# Patient Record
Sex: Female | Born: 1935 | Hispanic: No | Smoking: Never smoker
Health system: Southern US, Community
[De-identification: ages and names within clinical notes are randomized; demographics above are authoritative.]

## PROBLEM LIST (undated history)

## (undated) ENCOUNTER — Emergency Department (HOSPITAL_COMMUNITY): Admission: EM | Payer: Medicare HMO | Source: Home / Self Care

## (undated) DIAGNOSIS — I351 Nonrheumatic aortic (valve) insufficiency: Secondary | ICD-10-CM

## (undated) DIAGNOSIS — I5032 Chronic diastolic (congestive) heart failure: Secondary | ICD-10-CM

## (undated) HISTORY — DX: Chronic diastolic (congestive) heart failure: I50.32

## (undated) HISTORY — DX: Nonrheumatic aortic (valve) insufficiency: I35.1

---

## 1984-02-01 HISTORY — PX: RHINOPLASTY: SUR1284

## 2000-10-04 ENCOUNTER — Other Ambulatory Visit: Admission: RE | Admit: 2000-10-04 | Discharge: 2000-10-04 | Payer: Self-pay | Admitting: Gastroenterology

## 2000-10-04 ENCOUNTER — Encounter (INDEPENDENT_AMBULATORY_CARE_PROVIDER_SITE_OTHER): Payer: Self-pay | Admitting: Specialist

## 2000-11-23 ENCOUNTER — Ambulatory Visit (HOSPITAL_COMMUNITY): Admission: RE | Admit: 2000-11-23 | Discharge: 2000-11-23 | Payer: Self-pay | Admitting: Gastroenterology

## 2000-11-23 ENCOUNTER — Encounter: Payer: Self-pay | Admitting: Gastroenterology

## 2008-03-10 ENCOUNTER — Ambulatory Visit: Payer: Self-pay | Admitting: Cardiology

## 2009-05-14 ENCOUNTER — Encounter (INDEPENDENT_AMBULATORY_CARE_PROVIDER_SITE_OTHER): Payer: Self-pay | Admitting: *Deleted

## 2009-05-18 ENCOUNTER — Ambulatory Visit: Payer: Self-pay | Admitting: Internal Medicine

## 2009-05-31 HISTORY — PX: COLONOSCOPY: SHX174

## 2009-06-12 ENCOUNTER — Ambulatory Visit: Payer: Self-pay | Admitting: Internal Medicine

## 2010-03-04 NOTE — Miscellaneous (Signed)
Summary: previsit/rm  Clinical Lists Changes  Medications: Added new medication of MIRALAX   POWD (POLYETHYLENE GLYCOL 3350) As per prep  instructions. - Signed Added new medication of DULCOLAX 5 MG  TBEC (BISACODYL) Day before procedure take 2 at 3pm and 2 at 8pm. - Signed Added new medication of REGLAN 10 MG  TABS (METOCLOPRAMIDE HCL) As per prep instructions. - Signed Rx of MIRALAX   POWD (POLYETHYLENE GLYCOL 3350) As per prep  instructions.;  #255gm x 0;  Signed;  Entered by: Sherren Kerns RN;  Authorized by: Hart Carwin MD;  Method used: Electronically to [facility]*, [address] [place], [place], [place]  [address], Ph: [phone], Fax: [phone] Rx of DULCOLAX 5 MG  TBEC (BISACODYL) Day before procedure take 2 at 3pm and 2 at 8pm.;  #4 x 0;  Signed;  Entered by: Sherren Kerns RN;  Authorized by: Hart Carwin MD;  Method used: Electronically to [facility]*, [address] [place], [place], [place]  [address], Ph: [phone], Fax: [phone] Rx of REGLAN 10 MG  TABS (METOCLOPRAMIDE HCL) As per prep instructions.;  #2 x 0;  Signed;  Entered by: Sherren Kerns RN;  Authorized by: Hart Carwin MD;  Method used: Electronically to [facility]*, [address] [place], [place], [place]  [address], Ph: [phone], Fax: [phone] Observations: Added new observation of ALLERGY REV: Done (05/18/2009 11:49)    Prescriptions: REGLAN 10 MG  TABS (METOCLOPRAMIDE HCL) As per prep instructions.  #2 x 0   Entered by:   Sherren Kerns RN   Authorized by:   Hart Carwin MD   Signed by:   Sherren Kerns RN on 05/18/2009   Method used:   Electronically to        [facility]* (retail)       [address]       [place], [place]  [address]       Ph: [phone]       Fax: [phone]   RxID:   [id number] DULCOLAX 5 MG  TBEC (BISACODYL) Day before procedure take 2 at 3pm and 2 at 8pm.  #4 x 0   Entered by:    Sherren Kerns RN   Authorized by:   Hart Carwin MD   Signed by:   Sherren Kerns RN on 05/18/2009   Method used:   Electronically to        [facility]* (retail)       [address]       [place], [place]  [address]       Ph: [phone]       Fax: [phone]   RxID:   [id number] MIRALAX   POWD (POLYETHYLENE GLYCOL 3350) As per prep  instructions.  #255gm x 0   Entered by:   Sherren Kerns RN   Authorized by:   Hart Carwin MD   Signed by:   Sherren Kerns RN on 05/18/2009   Method used:   Electronically to        [facility]* (retail)       [address]       [place], [place]  [address]       Ph: [phone]       Fax: [phone]  RxID:   [id number]

## 2010-03-04 NOTE — Procedures (Signed)
Summary: Colonoscopy  Patient: Karla Dunn Note: All result statuses are Final unless otherwise noted.  Tests: (1) Colonoscopy (COL)   COL Colonoscopy           DONE     [facility]     [address]     [place], [place]  [address]           COLONOSCOPY PROCEDURE REPORT           PATIENT:  [name], Name  MR#:  [id number]     BIRTHDATE:  11-Nov-1935, 73 yrs. old  GENDER:  female     ENDOSCOPIST:  Hedwig Morton. Juanda Chance, MD     REF. BY: Dr Tamera Reason,, [facility],[place]     PROCEDURE DATE:  06/12/2009     PROCEDURE:  Colonoscopy 29562     ASA CLASS:  Class II     INDICATIONS:  Routine Risk Screening lastexam was flex. sigm. in     2002, normal     MEDICATIONS:   Versed 5 mg, Fentanyl 50 mcg           DESCRIPTION OF PROCEDURE:   After the risks benefits and     alternatives of the procedure were thoroughly explained, informed     consent was obtained.  Digital rectal exam was performed and     revealed no rectal masses.   The LB [id number] endoscope     was introduced through the anus and advanced to the cecum, which     was identified by both the appendix and ileocecal valve, without     limitations.  The quality of the prep was good, using MiraLax.     The instrument was then slowly withdrawn as the colon was fully     examined.     <<PROCEDUREIMAGES>>           FINDINGS:  No polyps or cancers were seen (see image1, image2, and     image3).   Retroflexed views in the rectum revealed no     abnormalities.    The scope was then withdrawn from the patient     and the procedure completed.           COMPLICATIONS:  None     ENDOSCOPIC IMPRESSION:     1) No polyps or cancers     RECOMMENDATIONS:     1) high fiber diet     REPEAT EXAM:  In 10 year(s) for.           ______________________________     Hedwig Morton. Juanda Chance, MD           CC:           n.     eSIGNED:   Hedwig Morton. Kashara Blocher at 06/12/2009 11:27 AM           Karla Dunn, [id number]  Note: An  exclamation mark (!) indicates a result that was not dispersed into the flowsheet. Document Creation Date: 06/12/2009 11:27 AM _______________________________________________________________________  (1) Order result status: Final Collection or observation date-time: 06/12/2009 11:20 Requested date-time:  Receipt date-time:  Reported date-time:  Referring Physician:   Ordering Physician: Lina Sar [phone]) Specimen Source:  Source: Launa Grill Order Number: [id number] Lab site:   Appended Document: Colonoscopy    Clinical Lists Changes  Observations: Added new observation of COLONNXTDUE: 06/2019 (06/12/2009 11:32)

## 2010-03-04 NOTE — Letter (Signed)
Summary: [facility] Instructions  [facility]  [address] [place], [place] [address]   Phone: [phone]  Fax: [phone]       SYNDI PUA    1935-07-03    MRN: [id number]       Procedure Day /Date:  Friday 06/12/09     Arrival Time:  10:00AM     Procedure Time:  11:00AM     Location of Procedure:                    [facility]res _  [facility] (4th Floor)    PREPARATION FOR COLONOSCOPY WITH MIRALAX  Starting 5 days prior to your procedure 06/07/09 do not eat nuts, seeds, popcorn, corn, beans, peas,  salads, or any raw vegetables.  Do not take any fiber supplements (e.g. Metamucil, Citrucel, and Benefiber). ____________________________________________________________________________________________________   THE DAY BEFORE YOUR PROCEDURE         DATE:  06/11/09 DAY: Thursday  1   Drink clear liquids the entire day-NO SOLID FOOD  2   Do not drink anything colored red or purple.  Avoid juices with pulp.  No orange juice.  3   Drink at least 64 oz. (8 glasses) of fluid/clear liquids during the day to prevent dehydration and help the prep work efficiently.  CLEAR LIQUIDS INCLUDE: Water Jello Ice Popsicles Tea (sugar ok, no milk/cream) Powdered fruit flavored drinks Coffee (sugar ok, no milk/cream) Gatorade Juice: apple, white grape, white cranberry  Lemonade Clear bullion, consomm, broth Carbonated beverages (any kind) Strained chicken noodle soup Hard Candy  4   Mix the entire bottle of Miralax with 64 oz. of Gatorade/Powerade in the morning and put in the refrigerator to chill.  5   At 3:00 pm take 2 Dulcolax/Bisacodyl tablets.  6   At 4:30 pm take one Reglan/Metoclopramide tablet.  7  Starting at 5:00 pm drink one 8 oz glass of the Miralax mixture every 15-20 minutes until you have finished drinking the entire 64 oz.  You should finish drinking prep around 7:30 or 8:00 pm.  8   If you are nauseated, you may take the 2nd Reglan/Metoclopramide  tablet at 6:30 pm.        9    At 8:00 pm take 2 more DULCOLAX/Bisacodyl tablets.     THE DAY OF YOUR PROCEDURE      DATE:  06/12/09  DAY: Friday  You may drink clear liquids until 9:00AM  (2 HOURS BEFORE PROCEDURE).   MEDICATION INSTRUCTIONS  Unless otherwise instructed, you should take regular prescription medications with a small sip of water as early as possible the morning of your procedure.   Additional medication instructions: Hold Spiron-HCTZ morning of procedure only.         OTHER INSTRUCTIONS  You will need a responsible adult at least 73 years of age to accompany you and drive you home.   This person must remain in the waiting room during your procedure.  Wear loose fitting clothing that is easily removed.  Leave jewelry and other valuables at home.  However, you may wish to bring a book to read or an iPod/MP3 player to listen to music as you wait for your procedure to start.  Remove all body piercing jewelry and leave at home.  Total time from sign-in until discharge is approximately 2-3 hours.  You should go home directly after your procedure and rest.  You can resume normal activities the day after your procedure.  The  day of your procedure you should not:   Drive   Make legal decisions   Operate machinery   Drink alcohol   Return to work  You will receive specific instructions about eating, activities and medications before you leave.   The above instructions have been reviewed and explained to me by   Sherren Kerns RN  May 18, 2009 11:34 AM     I fully understand and can verbalize these instructions _____________________________ Date _______

## 2010-11-17 HISTORY — PX: CATARACT EXTRACTION: SUR2

## 2013-07-24 ENCOUNTER — Encounter: Payer: Self-pay | Admitting: Gastroenterology

## 2013-10-26 ENCOUNTER — Encounter: Payer: Self-pay | Admitting: Gastroenterology

## 2013-10-26 ENCOUNTER — Encounter: Payer: Self-pay | Admitting: Internal Medicine

## 2014-09-01 HISTORY — PX: OTHER SURGICAL HISTORY: SHX169

## 2015-03-12 DIAGNOSIS — K210 Gastro-esophageal reflux disease with esophagitis: Secondary | ICD-10-CM | POA: Diagnosis not present

## 2015-03-12 DIAGNOSIS — D509 Iron deficiency anemia, unspecified: Secondary | ICD-10-CM | POA: Diagnosis not present

## 2015-03-12 DIAGNOSIS — N8110 Cystocele, unspecified: Secondary | ICD-10-CM | POA: Diagnosis not present

## 2015-03-12 DIAGNOSIS — R269 Unspecified abnormalities of gait and mobility: Secondary | ICD-10-CM | POA: Diagnosis not present

## 2015-03-12 DIAGNOSIS — N182 Chronic kidney disease, stage 2 (mild): Secondary | ICD-10-CM | POA: Diagnosis not present

## 2015-03-12 DIAGNOSIS — N3946 Mixed incontinence: Secondary | ICD-10-CM | POA: Diagnosis not present

## 2015-03-12 DIAGNOSIS — N816 Rectocele: Secondary | ICD-10-CM | POA: Diagnosis not present

## 2015-03-12 DIAGNOSIS — R69 Illness, unspecified: Secondary | ICD-10-CM | POA: Diagnosis not present

## 2015-03-12 DIAGNOSIS — I10 Essential (primary) hypertension: Secondary | ICD-10-CM | POA: Diagnosis not present

## 2015-03-12 DIAGNOSIS — E1169 Type 2 diabetes mellitus with other specified complication: Secondary | ICD-10-CM | POA: Diagnosis not present

## 2015-03-12 DIAGNOSIS — H9193 Unspecified hearing loss, bilateral: Secondary | ICD-10-CM | POA: Diagnosis not present

## 2015-03-16 DIAGNOSIS — M329 Systemic lupus erythematosus, unspecified: Secondary | ICD-10-CM | POA: Diagnosis not present

## 2015-03-16 DIAGNOSIS — M6281 Muscle weakness (generalized): Secondary | ICD-10-CM | POA: Diagnosis not present

## 2015-03-16 DIAGNOSIS — I679 Cerebrovascular disease, unspecified: Secondary | ICD-10-CM | POA: Diagnosis not present

## 2015-03-16 DIAGNOSIS — R32 Unspecified urinary incontinence: Secondary | ICD-10-CM | POA: Diagnosis not present

## 2015-03-30 DIAGNOSIS — M6281 Muscle weakness (generalized): Secondary | ICD-10-CM | POA: Diagnosis not present

## 2015-03-30 DIAGNOSIS — R2681 Unsteadiness on feet: Secondary | ICD-10-CM | POA: Diagnosis not present

## 2015-03-30 DIAGNOSIS — I679 Cerebrovascular disease, unspecified: Secondary | ICD-10-CM | POA: Diagnosis not present

## 2015-04-02 DIAGNOSIS — R2681 Unsteadiness on feet: Secondary | ICD-10-CM | POA: Diagnosis not present

## 2015-04-02 DIAGNOSIS — M6281 Muscle weakness (generalized): Secondary | ICD-10-CM | POA: Diagnosis not present

## 2015-04-16 ENCOUNTER — Ambulatory Visit (INDEPENDENT_AMBULATORY_CARE_PROVIDER_SITE_OTHER): Payer: Self-pay | Admitting: Otolaryngology

## 2015-04-23 DIAGNOSIS — N8110 Cystocele, unspecified: Secondary | ICD-10-CM | POA: Diagnosis not present

## 2015-04-23 DIAGNOSIS — I10 Essential (primary) hypertension: Secondary | ICD-10-CM | POA: Diagnosis not present

## 2015-04-23 DIAGNOSIS — R69 Illness, unspecified: Secondary | ICD-10-CM | POA: Diagnosis not present

## 2015-04-23 DIAGNOSIS — N816 Rectocele: Secondary | ICD-10-CM | POA: Diagnosis not present

## 2015-04-23 DIAGNOSIS — E1169 Type 2 diabetes mellitus with other specified complication: Secondary | ICD-10-CM | POA: Diagnosis not present

## 2015-04-23 DIAGNOSIS — K210 Gastro-esophageal reflux disease with esophagitis: Secondary | ICD-10-CM | POA: Diagnosis not present

## 2015-04-23 DIAGNOSIS — N182 Chronic kidney disease, stage 2 (mild): Secondary | ICD-10-CM | POA: Diagnosis not present

## 2015-05-05 DIAGNOSIS — M6281 Muscle weakness (generalized): Secondary | ICD-10-CM | POA: Diagnosis not present

## 2015-05-05 DIAGNOSIS — I739 Peripheral vascular disease, unspecified: Secondary | ICD-10-CM | POA: Diagnosis not present

## 2015-05-05 DIAGNOSIS — R2681 Unsteadiness on feet: Secondary | ICD-10-CM | POA: Diagnosis not present

## 2015-05-21 DIAGNOSIS — H109 Unspecified conjunctivitis: Secondary | ICD-10-CM | POA: Diagnosis not present

## 2015-08-17 DIAGNOSIS — I8310 Varicose veins of unspecified lower extremity with inflammation: Secondary | ICD-10-CM | POA: Diagnosis not present

## 2015-08-17 DIAGNOSIS — L821 Other seborrheic keratosis: Secondary | ICD-10-CM | POA: Diagnosis not present

## 2015-08-26 DIAGNOSIS — M329 Systemic lupus erythematosus, unspecified: Secondary | ICD-10-CM | POA: Diagnosis not present

## 2015-08-26 DIAGNOSIS — M810 Age-related osteoporosis without current pathological fracture: Secondary | ICD-10-CM | POA: Diagnosis not present

## 2015-09-15 DIAGNOSIS — M3213 Lung involvement in systemic lupus erythematosus: Secondary | ICD-10-CM | POA: Diagnosis not present

## 2015-09-15 DIAGNOSIS — Z961 Presence of intraocular lens: Secondary | ICD-10-CM | POA: Diagnosis not present

## 2015-09-15 DIAGNOSIS — Z79899 Other long term (current) drug therapy: Secondary | ICD-10-CM | POA: Diagnosis not present

## 2015-10-21 DIAGNOSIS — Z23 Encounter for immunization: Secondary | ICD-10-CM | POA: Diagnosis not present

## 2015-10-21 DIAGNOSIS — D509 Iron deficiency anemia, unspecified: Secondary | ICD-10-CM | POA: Diagnosis not present

## 2015-10-21 DIAGNOSIS — R69 Illness, unspecified: Secondary | ICD-10-CM | POA: Diagnosis not present

## 2015-10-21 DIAGNOSIS — E1169 Type 2 diabetes mellitus with other specified complication: Secondary | ICD-10-CM | POA: Diagnosis not present

## 2015-11-02 DIAGNOSIS — H906 Mixed conductive and sensorineural hearing loss, bilateral: Secondary | ICD-10-CM | POA: Diagnosis not present

## 2015-11-09 DIAGNOSIS — R269 Unspecified abnormalities of gait and mobility: Secondary | ICD-10-CM | POA: Diagnosis not present

## 2015-11-09 DIAGNOSIS — R2689 Other abnormalities of gait and mobility: Secondary | ICD-10-CM | POA: Diagnosis not present

## 2015-12-18 DIAGNOSIS — E1169 Type 2 diabetes mellitus with other specified complication: Secondary | ICD-10-CM | POA: Diagnosis not present

## 2015-12-18 DIAGNOSIS — I10 Essential (primary) hypertension: Secondary | ICD-10-CM | POA: Diagnosis not present

## 2015-12-18 DIAGNOSIS — M810 Age-related osteoporosis without current pathological fracture: Secondary | ICD-10-CM | POA: Diagnosis not present

## 2015-12-18 DIAGNOSIS — N182 Chronic kidney disease, stage 2 (mild): Secondary | ICD-10-CM | POA: Diagnosis not present

## 2015-12-18 DIAGNOSIS — K210 Gastro-esophageal reflux disease with esophagitis: Secondary | ICD-10-CM | POA: Diagnosis not present

## 2015-12-18 DIAGNOSIS — D509 Iron deficiency anemia, unspecified: Secondary | ICD-10-CM | POA: Diagnosis not present

## 2015-12-22 DIAGNOSIS — I10 Essential (primary) hypertension: Secondary | ICD-10-CM | POA: Diagnosis not present

## 2015-12-22 DIAGNOSIS — E1169 Type 2 diabetes mellitus with other specified complication: Secondary | ICD-10-CM | POA: Diagnosis not present

## 2015-12-22 DIAGNOSIS — K210 Gastro-esophageal reflux disease with esophagitis: Secondary | ICD-10-CM | POA: Diagnosis not present

## 2015-12-22 DIAGNOSIS — D509 Iron deficiency anemia, unspecified: Secondary | ICD-10-CM | POA: Diagnosis not present

## 2015-12-22 DIAGNOSIS — R69 Illness, unspecified: Secondary | ICD-10-CM | POA: Diagnosis not present

## 2015-12-22 DIAGNOSIS — H9193 Unspecified hearing loss, bilateral: Secondary | ICD-10-CM | POA: Diagnosis not present

## 2015-12-22 DIAGNOSIS — Z681 Body mass index (BMI) 19 or less, adult: Secondary | ICD-10-CM | POA: Diagnosis not present

## 2016-02-15 DIAGNOSIS — H9193 Unspecified hearing loss, bilateral: Secondary | ICD-10-CM | POA: Diagnosis not present

## 2016-02-15 DIAGNOSIS — K210 Gastro-esophageal reflux disease with esophagitis: Secondary | ICD-10-CM | POA: Diagnosis not present

## 2016-02-15 DIAGNOSIS — Z681 Body mass index (BMI) 19 or less, adult: Secondary | ICD-10-CM | POA: Diagnosis not present

## 2016-02-15 DIAGNOSIS — E1169 Type 2 diabetes mellitus with other specified complication: Secondary | ICD-10-CM | POA: Diagnosis not present

## 2016-02-15 DIAGNOSIS — I10 Essential (primary) hypertension: Secondary | ICD-10-CM | POA: Diagnosis not present

## 2016-02-15 DIAGNOSIS — M545 Low back pain: Secondary | ICD-10-CM | POA: Diagnosis not present

## 2016-02-15 DIAGNOSIS — M329 Systemic lupus erythematosus, unspecified: Secondary | ICD-10-CM | POA: Diagnosis not present

## 2016-02-15 DIAGNOSIS — D509 Iron deficiency anemia, unspecified: Secondary | ICD-10-CM | POA: Diagnosis not present

## 2016-02-15 DIAGNOSIS — N182 Chronic kidney disease, stage 2 (mild): Secondary | ICD-10-CM | POA: Diagnosis not present

## 2016-02-15 DIAGNOSIS — R69 Illness, unspecified: Secondary | ICD-10-CM | POA: Diagnosis not present

## 2016-02-22 DIAGNOSIS — E1122 Type 2 diabetes mellitus with diabetic chronic kidney disease: Secondary | ICD-10-CM | POA: Diagnosis not present

## 2016-02-22 DIAGNOSIS — F329 Major depressive disorder, single episode, unspecified: Secondary | ICD-10-CM | POA: Diagnosis not present

## 2016-02-22 DIAGNOSIS — I129 Hypertensive chronic kidney disease with stage 1 through stage 4 chronic kidney disease, or unspecified chronic kidney disease: Secondary | ICD-10-CM | POA: Diagnosis not present

## 2016-02-22 DIAGNOSIS — M329 Systemic lupus erythematosus, unspecified: Secondary | ICD-10-CM | POA: Diagnosis not present

## 2016-02-22 DIAGNOSIS — D631 Anemia in chronic kidney disease: Secondary | ICD-10-CM | POA: Diagnosis not present

## 2016-02-22 DIAGNOSIS — N189 Chronic kidney disease, unspecified: Secondary | ICD-10-CM | POA: Diagnosis not present

## 2016-02-22 DIAGNOSIS — R2689 Other abnormalities of gait and mobility: Secondary | ICD-10-CM | POA: Diagnosis not present

## 2016-02-22 DIAGNOSIS — R69 Illness, unspecified: Secondary | ICD-10-CM | POA: Diagnosis not present

## 2016-02-22 DIAGNOSIS — M810 Age-related osteoporosis without current pathological fracture: Secondary | ICD-10-CM | POA: Diagnosis not present

## 2016-02-22 DIAGNOSIS — K219 Gastro-esophageal reflux disease without esophagitis: Secondary | ICD-10-CM | POA: Diagnosis not present

## 2016-02-23 DIAGNOSIS — N189 Chronic kidney disease, unspecified: Secondary | ICD-10-CM | POA: Diagnosis not present

## 2016-02-23 DIAGNOSIS — I129 Hypertensive chronic kidney disease with stage 1 through stage 4 chronic kidney disease, or unspecified chronic kidney disease: Secondary | ICD-10-CM | POA: Diagnosis not present

## 2016-02-23 DIAGNOSIS — K219 Gastro-esophageal reflux disease without esophagitis: Secondary | ICD-10-CM | POA: Diagnosis not present

## 2016-02-23 DIAGNOSIS — D631 Anemia in chronic kidney disease: Secondary | ICD-10-CM | POA: Diagnosis not present

## 2016-02-23 DIAGNOSIS — R2689 Other abnormalities of gait and mobility: Secondary | ICD-10-CM | POA: Diagnosis not present

## 2016-02-23 DIAGNOSIS — E1122 Type 2 diabetes mellitus with diabetic chronic kidney disease: Secondary | ICD-10-CM | POA: Diagnosis not present

## 2016-02-23 DIAGNOSIS — R69 Illness, unspecified: Secondary | ICD-10-CM | POA: Diagnosis not present

## 2016-02-23 DIAGNOSIS — F329 Major depressive disorder, single episode, unspecified: Secondary | ICD-10-CM | POA: Diagnosis not present

## 2016-02-23 DIAGNOSIS — M810 Age-related osteoporosis without current pathological fracture: Secondary | ICD-10-CM | POA: Diagnosis not present

## 2016-02-23 DIAGNOSIS — M329 Systemic lupus erythematosus, unspecified: Secondary | ICD-10-CM | POA: Diagnosis not present

## 2016-02-29 DIAGNOSIS — D631 Anemia in chronic kidney disease: Secondary | ICD-10-CM | POA: Diagnosis not present

## 2016-02-29 DIAGNOSIS — K219 Gastro-esophageal reflux disease without esophagitis: Secondary | ICD-10-CM | POA: Diagnosis not present

## 2016-02-29 DIAGNOSIS — R69 Illness, unspecified: Secondary | ICD-10-CM | POA: Diagnosis not present

## 2016-02-29 DIAGNOSIS — M329 Systemic lupus erythematosus, unspecified: Secondary | ICD-10-CM | POA: Diagnosis not present

## 2016-02-29 DIAGNOSIS — I129 Hypertensive chronic kidney disease with stage 1 through stage 4 chronic kidney disease, or unspecified chronic kidney disease: Secondary | ICD-10-CM | POA: Diagnosis not present

## 2016-02-29 DIAGNOSIS — N189 Chronic kidney disease, unspecified: Secondary | ICD-10-CM | POA: Diagnosis not present

## 2016-02-29 DIAGNOSIS — E1122 Type 2 diabetes mellitus with diabetic chronic kidney disease: Secondary | ICD-10-CM | POA: Diagnosis not present

## 2016-02-29 DIAGNOSIS — F329 Major depressive disorder, single episode, unspecified: Secondary | ICD-10-CM | POA: Diagnosis not present

## 2016-02-29 DIAGNOSIS — R2689 Other abnormalities of gait and mobility: Secondary | ICD-10-CM | POA: Diagnosis not present

## 2016-02-29 DIAGNOSIS — M810 Age-related osteoporosis without current pathological fracture: Secondary | ICD-10-CM | POA: Diagnosis not present

## 2016-03-01 DIAGNOSIS — M329 Systemic lupus erythematosus, unspecified: Secondary | ICD-10-CM | POA: Diagnosis not present

## 2016-03-01 DIAGNOSIS — M810 Age-related osteoporosis without current pathological fracture: Secondary | ICD-10-CM | POA: Diagnosis not present

## 2016-03-01 DIAGNOSIS — Z681 Body mass index (BMI) 19 or less, adult: Secondary | ICD-10-CM | POA: Diagnosis not present

## 2016-03-01 DIAGNOSIS — J984 Other disorders of lung: Secondary | ICD-10-CM | POA: Diagnosis not present

## 2016-03-22 DIAGNOSIS — S63287A Dislocation of proximal interphalangeal joint of left little finger, initial encounter: Secondary | ICD-10-CM | POA: Diagnosis not present

## 2016-03-25 DIAGNOSIS — S63287D Dislocation of proximal interphalangeal joint of left little finger, subsequent encounter: Secondary | ICD-10-CM | POA: Diagnosis not present

## 2016-04-26 DIAGNOSIS — Z961 Presence of intraocular lens: Secondary | ICD-10-CM | POA: Diagnosis not present

## 2016-04-26 DIAGNOSIS — Z79899 Other long term (current) drug therapy: Secondary | ICD-10-CM | POA: Diagnosis not present

## 2016-04-26 DIAGNOSIS — M3213 Lung involvement in systemic lupus erythematosus: Secondary | ICD-10-CM | POA: Diagnosis not present

## 2016-04-27 DIAGNOSIS — S63287D Dislocation of proximal interphalangeal joint of left little finger, subsequent encounter: Secondary | ICD-10-CM | POA: Diagnosis not present

## 2016-04-27 DIAGNOSIS — S63095S Other dislocation of left wrist and hand, sequela: Secondary | ICD-10-CM | POA: Diagnosis not present

## 2016-05-03 DIAGNOSIS — I340 Nonrheumatic mitral (valve) insufficiency: Secondary | ICD-10-CM | POA: Diagnosis not present

## 2016-05-03 DIAGNOSIS — I352 Nonrheumatic aortic (valve) stenosis with insufficiency: Secondary | ICD-10-CM | POA: Diagnosis not present

## 2016-05-03 DIAGNOSIS — I6523 Occlusion and stenosis of bilateral carotid arteries: Secondary | ICD-10-CM | POA: Diagnosis not present

## 2016-05-03 DIAGNOSIS — I517 Cardiomegaly: Secondary | ICD-10-CM | POA: Diagnosis not present

## 2016-05-03 DIAGNOSIS — R531 Weakness: Secondary | ICD-10-CM | POA: Diagnosis not present

## 2016-05-03 DIAGNOSIS — R5383 Other fatigue: Secondary | ICD-10-CM | POA: Diagnosis not present

## 2016-05-03 DIAGNOSIS — I071 Rheumatic tricuspid insufficiency: Secondary | ICD-10-CM | POA: Diagnosis not present

## 2016-05-12 DIAGNOSIS — W19XXXD Unspecified fall, subsequent encounter: Secondary | ICD-10-CM | POA: Diagnosis not present

## 2016-05-12 DIAGNOSIS — M24432 Recurrent dislocation, left wrist: Secondary | ICD-10-CM | POA: Diagnosis not present

## 2016-05-12 DIAGNOSIS — S63257D Unspecified dislocation of left little finger, subsequent encounter: Secondary | ICD-10-CM | POA: Diagnosis not present

## 2016-05-26 ENCOUNTER — Ambulatory Visit: Payer: Self-pay | Admitting: Cardiovascular Disease

## 2016-05-26 ENCOUNTER — Encounter: Payer: Self-pay | Admitting: *Deleted

## 2016-06-01 DIAGNOSIS — M24432 Recurrent dislocation, left wrist: Secondary | ICD-10-CM | POA: Diagnosis not present

## 2016-06-01 DIAGNOSIS — X58XXXD Exposure to other specified factors, subsequent encounter: Secondary | ICD-10-CM | POA: Diagnosis not present

## 2016-06-01 DIAGNOSIS — S63257D Unspecified dislocation of left little finger, subsequent encounter: Secondary | ICD-10-CM | POA: Diagnosis not present

## 2016-06-03 DIAGNOSIS — M24432 Recurrent dislocation, left wrist: Secondary | ICD-10-CM | POA: Diagnosis not present

## 2016-06-03 DIAGNOSIS — X58XXXD Exposure to other specified factors, subsequent encounter: Secondary | ICD-10-CM | POA: Diagnosis not present

## 2016-06-03 DIAGNOSIS — S63257D Unspecified dislocation of left little finger, subsequent encounter: Secondary | ICD-10-CM | POA: Diagnosis not present

## 2016-06-08 DIAGNOSIS — M24432 Recurrent dislocation, left wrist: Secondary | ICD-10-CM | POA: Diagnosis not present

## 2016-06-08 DIAGNOSIS — X58XXXD Exposure to other specified factors, subsequent encounter: Secondary | ICD-10-CM | POA: Diagnosis not present

## 2016-06-08 DIAGNOSIS — S63257D Unspecified dislocation of left little finger, subsequent encounter: Secondary | ICD-10-CM | POA: Diagnosis not present

## 2016-06-09 ENCOUNTER — Encounter: Payer: Self-pay | Admitting: *Deleted

## 2016-06-10 ENCOUNTER — Encounter: Payer: Self-pay | Admitting: *Deleted

## 2016-06-10 ENCOUNTER — Ambulatory Visit (INDEPENDENT_AMBULATORY_CARE_PROVIDER_SITE_OTHER): Payer: Medicare HMO | Admitting: Cardiovascular Disease

## 2016-06-10 ENCOUNTER — Encounter: Payer: Self-pay | Admitting: Cardiovascular Disease

## 2016-06-10 VITALS — BP 126/64 | HR 88 | Ht 63.0 in | Wt 92.0 lb

## 2016-06-10 DIAGNOSIS — I359 Nonrheumatic aortic valve disorder, unspecified: Secondary | ICD-10-CM | POA: Diagnosis not present

## 2016-06-10 DIAGNOSIS — I351 Nonrheumatic aortic (valve) insufficiency: Secondary | ICD-10-CM | POA: Diagnosis not present

## 2016-06-10 DIAGNOSIS — I5032 Chronic diastolic (congestive) heart failure: Secondary | ICD-10-CM | POA: Insufficient documentation

## 2016-06-10 DIAGNOSIS — I10 Essential (primary) hypertension: Secondary | ICD-10-CM

## 2016-06-10 DIAGNOSIS — I350 Nonrheumatic aortic (valve) stenosis: Secondary | ICD-10-CM | POA: Diagnosis not present

## 2016-06-10 NOTE — Patient Instructions (Signed)
Your physician recommends that you schedule a follow-up appointment in: 1 MONTH WITH DR Purvis SheffieldKONESWARAN  Your physician recommends that you continue on your current medications as directed. Please refer to the Current Medication list given to you today.  Thank you for choosing [facility]!!

## 2016-06-10 NOTE — Progress Notes (Signed)
CARDIOLOGY CONSULT NOTE  Patient ID: Karla Dunn MRN: [id number] DOB/AGE: 03/25/1935 81 y.o.  Admit date: (Not on file) Primary Physician: Juliette Alcide, MD Referring Physician: Leandrew Koyanagi  Reason for Consultation: aortic valve disease  HPI: Karla Dunn is a 81 y.o. female who is being seen today for the evaluation of aortic valve disease at the request of Burdine, Ananias Pilgrim, MD.   An echocardiogram performed at [facility] on 05/03/16 reportedly demonstrated mildly reduced left ventricular systolic function, LVEF 45-50%, normal LV size, mild LVH, normal regional wall motion, grade 2 diastolic dysfunction, elevated left atrial pressure, moderate to severe aortic regurgitation, moderate aortic stenosis, mild mitral and mild to moderate tricuspid regurgitation.  Pressure half time 452 ms, aortic root diameter 3.4 cm, aortic valve area 1.23 cm.  A review of labs performed on 02/15/16 showed BUN 22, creatinine 0.81, sodium 139, potassium 4.4, hemoglobin 12.8, platelets 336, TSH 5.3.  She is hard of hearing.  She said she has had shortness of breath for the past 10 years but this has been increasing recently. She sometimes has mild leg edema but has not required a diuretic. She denies chest pain and paroxysmal nocturnal dyspnea. She has recently been sleeping only 2-3 pillows. She denies syncope.  She denies a history of smoking. She does have seasonal allergies.  She denies a history of MI.  ECG performed in the office today which I ordered and personally interpreted demonstrated sinus rhythm with a diffuse nonspecific T wave abnormality, 87 bpm.   Allergies  Allergen Reactions  . Iron Complex [Chromagen]     Oral forms causes Nausea but can take the IV forms    Current Outpatient Prescriptions  Medication Sig Dispense Refill  . albuterol (PROAIR HFA) 108 (90 Base) MCG/ACT inhaler Inhale into the lungs every 6 (six) hours as needed for wheezing or shortness of  breath.    Marland Kitchen aspirin EC 81 MG tablet Take 81 mg by mouth daily.    Marland Kitchen buPROPion (WELLBUTRIN) 75 MG tablet Take 75 mg by mouth 3 (three) times daily.    . Calcium Carbonate-Vit D-Min (CALTRATE 600+D PLUS MINERALS) 600-800 MG-UNIT TABS Take 1 tablet by mouth 2 (two) times daily.    . hydroxychloroquine (PLAQUENIL) 200 MG tablet Take 1 tablet by mouth daily.    Marland Kitchen ibandronate (BONIVA) 150 MG tablet Take 150 mg by mouth every 30 (thirty) days. Take in the morning with a full glass of water, on an empty stomach, and do not take anything else by mouth or lie down for the next 30 min.    Marland Kitchen omeprazole (PRILOSEC OTC) 20 MG tablet Take 20 mg by mouth daily.    Marland Kitchen PARoxetine (PAXIL) 20 MG tablet Take 20 mg by mouth 2 (two) times daily.     . polyethylene glycol (MIRALAX / GLYCOLAX) packet Take 17 g by mouth daily.    Marland Kitchen spironolactone-hydrochlorothiazide (ALDACTAZIDE) 25-25 MG tablet Take 1 tablet by mouth daily.    . temazepam (RESTORIL) 15 MG capsule Take 30 mg by mouth at bedtime as needed for sleep.      No current facility-administered medications for this visit.     Past Medical History:  Diagnosis Date  . Chronic diastolic heart failure (HCC)   . Nonrheumatic aortic valve insufficiency     Past Surgical History:  Procedure Laterality Date  . CATARACT EXTRACTION  11/17/2010  . COLONOSCOPY  05/2009  . OTHER SURGICAL HISTORY  09/2014  hysterectomy wtih rt leimyoma removal   . RHINOPLASTY  1986    Social History   Social History  . Marital status: Widowed    Spouse name: N/A  . Number of children: N/A  . Years of education: N/A   Occupational History  . Not on file.   Social History Main Topics  . Smoking status: Never Smoker  . Smokeless tobacco: Never Used  . Alcohol use Not on file  . Drug use: Unknown  . Sexual activity: Not on file   Other Topics Concern  . Not on file   Social History Narrative  . No narrative on file     No family history of premature CAD in 1st  degree relatives.  Current Meds  Medication Sig  . albuterol (PROAIR HFA) 108 (90 Base) MCG/ACT inhaler Inhale into the lungs every 6 (six) hours as needed for wheezing or shortness of breath.  Marland Kitchen aspirin EC 81 MG tablet Take 81 mg by mouth daily.  Marland Kitchen buPROPion (WELLBUTRIN) 75 MG tablet Take 75 mg by mouth 3 (three) times daily.  . Calcium Carbonate-Vit D-Min (CALTRATE 600+D PLUS MINERALS) 600-800 MG-UNIT TABS Take 1 tablet by mouth 2 (two) times daily.  . hydroxychloroquine (PLAQUENIL) 200 MG tablet Take 1 tablet by mouth daily.  Marland Kitchen ibandronate (BONIVA) 150 MG tablet Take 150 mg by mouth every 30 (thirty) days. Take in the morning with a full glass of water, on an empty stomach, and do not take anything else by mouth or lie down for the next 30 min.  Marland Kitchen omeprazole (PRILOSEC OTC) 20 MG tablet Take 20 mg by mouth daily.  Marland Kitchen PARoxetine (PAXIL) 20 MG tablet Take 20 mg by mouth 2 (two) times daily.   . polyethylene glycol (MIRALAX / GLYCOLAX) packet Take 17 g by mouth daily.  Marland Kitchen spironolactone-hydrochlorothiazide (ALDACTAZIDE) 25-25 MG tablet Take 1 tablet by mouth daily.  . temazepam (RESTORIL) 15 MG capsule Take 30 mg by mouth at bedtime as needed for sleep.       Review of systems complete and found to be negative unless listed above in HPI    Physical exam Blood pressure 126/64, pulse 88, height 5\' 3"  (1.6 m), weight 92 lb (41.7 kg), SpO2 96 %. General: NAD Neck: No JVD, no thyromegaly or thyroid nodule.  Lungs: Clear to auscultation bilaterally with normal respiratory effort. CV: Nondisplaced PMI. Regular rate and rhythm, normal S1/S2, no S3/S4, 2/6 systolic murmur over RUSB, 1/4 holodiastolic murmur along left lower sternal border.  No peripheral edema.     Abdomen: Soft, nontender, no distention.  Skin: Intact without lesions or rashes.  Neurologic: Alert and oriented x 3.  Psych: Normal affect. Extremities: No clubbing or cyanosis.  HEENT: Normal.   ECG: Most recent ECG  reviewed.   Labs: No results found for: K, BUN, CREATININE, ALT, TSH, HGB   Lipids: No results found for: LDLCALC, LDLDIRECT, CHOL, TRIG, HDL      ASSESSMENT AND PLAN:  1. Aortic valve disease with moderate stenosis and reportedly moderate to severe regurgitation: There are no measurements reported such as vena contracta nor regurgitant fraction or volume. There is no mention of holodiastolic reversal. I will try and obtain a copy of the echocardiographic images to determine the next course of action. She may require a repeat transthoracic echocardiogram for further clarification. If she indeed has reduced left ventricular systolic function and severe aortic regurgitation with moderate aortic stenosis, she may be a candidate for TAVR.  2. Hypertension: Controlled  on present therapy which includes spironolactone and hydrochlorothiazide. No changes.  Disposition: Follow up in 1 month  Signed: Prentice DockerSuresh Tonishia Steffy, M.D., F.A.C.C.  06/10/2016, 3:20 PM

## 2016-06-17 DIAGNOSIS — S63257D Unspecified dislocation of left little finger, subsequent encounter: Secondary | ICD-10-CM | POA: Diagnosis not present

## 2016-06-17 DIAGNOSIS — M24432 Recurrent dislocation, left wrist: Secondary | ICD-10-CM | POA: Diagnosis not present

## 2016-06-17 DIAGNOSIS — X58XXXD Exposure to other specified factors, subsequent encounter: Secondary | ICD-10-CM | POA: Diagnosis not present

## 2016-07-06 ENCOUNTER — Encounter: Payer: Self-pay | Admitting: Cardiovascular Disease

## 2016-07-06 ENCOUNTER — Ambulatory Visit (INDEPENDENT_AMBULATORY_CARE_PROVIDER_SITE_OTHER): Payer: Medicare HMO | Admitting: Cardiovascular Disease

## 2016-07-06 ENCOUNTER — Telehealth: Payer: Self-pay | Admitting: Cardiovascular Disease

## 2016-07-06 VITALS — BP 145/71 | HR 73 | Ht 63.0 in | Wt 93.0 lb

## 2016-07-06 DIAGNOSIS — R531 Weakness: Secondary | ICD-10-CM | POA: Diagnosis not present

## 2016-07-06 DIAGNOSIS — I10 Essential (primary) hypertension: Secondary | ICD-10-CM

## 2016-07-06 DIAGNOSIS — I350 Nonrheumatic aortic (valve) stenosis: Secondary | ICD-10-CM

## 2016-07-06 DIAGNOSIS — R5383 Other fatigue: Secondary | ICD-10-CM

## 2016-07-06 DIAGNOSIS — I359 Nonrheumatic aortic valve disorder, unspecified: Secondary | ICD-10-CM | POA: Diagnosis not present

## 2016-07-06 DIAGNOSIS — I351 Nonrheumatic aortic (valve) insufficiency: Secondary | ICD-10-CM

## 2016-07-06 NOTE — Progress Notes (Signed)
SUBJECTIVE: The patient returns for follow-up of aortic valve disease.  An echocardiogram performed at [facility] on 05/03/16 reportedly demonstrated mildly reduced left ventricular systolic function, LVEF 45-50%, normal LV size, mild LVH, normal regional wall motion, grade 2 diastolic dysfunction, elevated left atrial pressure, moderate to severe aortic regurgitation, moderate aortic stenosis, mild mitral and mild to moderate tricuspid regurgitation.  Pressure half time 452 ms, aortic root diameter 3.4 cm, aortic valve area 1.23 cm.  She is hard of hearing.  Since her last visit with me, she feels slightly more weak and tired. She has exertional dyspnea only after showering in changing her clothes and has been active. She denies fevers and cough at present. She also denies orthopnea, leg edema, and paroxysmal nocturnal dyspnea. She denies chest pain. She has no prior history of myocardial infarction.   Review of Systems: As per "subjective", otherwise negative.  Allergies  Allergen Reactions  . Iron Complex [Chromagen]     Oral forms causes Nausea but can take the IV forms    Current Outpatient Prescriptions  Medication Sig Dispense Refill  . albuterol (PROAIR HFA) 108 (90 Base) MCG/ACT inhaler Inhale into the lungs every 6 (six) hours as needed for wheezing or shortness of breath.    Marland Kitchen aspirin EC 81 MG tablet Take 81 mg by mouth daily.    Marland Kitchen buPROPion (WELLBUTRIN) 75 MG tablet Take 75 mg by mouth 3 (three) times daily.    . Calcium Carbonate-Vit D-Min (CALTRATE 600+D PLUS MINERALS) 600-800 MG-UNIT TABS Take 1 tablet by mouth 2 (two) times daily.    . hydroxychloroquine (PLAQUENIL) 200 MG tablet Take 1 tablet by mouth daily.    Marland Kitchen ibandronate (BONIVA) 150 MG tablet Take 150 mg by mouth every 30 (thirty) days. Take in the morning with a full glass of water, on an empty stomach, and do not take anything else by mouth or lie down for the next 30 min.    Marland Kitchen omeprazole (PRILOSEC OTC)  20 MG tablet Take 20 mg by mouth daily.    Marland Kitchen PARoxetine (PAXIL) 20 MG tablet Take 20 mg by mouth 2 (two) times daily.     . polyethylene glycol (MIRALAX / GLYCOLAX) packet Take 17 g by mouth daily.    Marland Kitchen spironolactone-hydrochlorothiazide (ALDACTAZIDE) 25-25 MG tablet Take 1 tablet by mouth daily.    . temazepam (RESTORIL) 15 MG capsule Take 30 mg by mouth at bedtime as needed for sleep.      No current facility-administered medications for this visit.     Past Medical History:  Diagnosis Date  . Chronic diastolic heart failure (HCC)   . Nonrheumatic aortic valve insufficiency     Past Surgical History:  Procedure Laterality Date  . CATARACT EXTRACTION  11/17/2010  . COLONOSCOPY  05/2009  . OTHER SURGICAL HISTORY  09/2014   hysterectomy wtih rt leimyoma removal   . RHINOPLASTY  1986    Social History   Social History  . Marital status: Widowed    Spouse name: N/A  . Number of children: N/A  . Years of education: N/A   Occupational History  . Not on file.   Social History Main Topics  . Smoking status: Never Smoker  . Smokeless tobacco: Never Used  . Alcohol use Not on file  . Drug use: Unknown  . Sexual activity: Not on file   Other Topics Concern  . Not on file   Social History Narrative  . No narrative on file  Vitals:   07/06/16 1043  BP: (!) 145/71  Pulse: 73  SpO2: 95%  Weight: 93 lb (42.2 kg)  Height: 5\' 3"  (1.6 m)    Wt Readings from Last 3 Encounters:  07/06/16 93 lb (42.2 kg)  06/10/16 92 lb (41.7 kg)     PHYSICAL EXAM General: NAD HEENT: Normal. Neck: No JVD, no thyromegaly. Lungs: Clear to auscultation bilaterally with normal respiratory effort. CV: Nondisplaced PMI. Regular rate and rhythm, normal S1/S2, no S3/S4, 2/6 systolic murmur over RUSB, 1/4 holodiastolic murmur along left lower sternal border.  No peripheral edema.     Abdomen: Soft, nontender, no distention.  Neurologic: Alert and oriented.  Psych: Normal affect. Skin:  Normal. Musculoskeletal: No gross deformities.    ECG: Most recent ECG reviewed.   Labs: No results found for: K, BUN, CREATININE, ALT, TSH, HGB   Lipids: No results found for: LDLCALC, LDLDIRECT, CHOL, TRIG, HDL     ASSESSMENT AND PLAN: 1. Aortic valve disease with moderate stenosis and reportedly moderate to severe regurgitation: I have been unable to open the CD copy of the echocardiographic images. I will obtain a repeat echocardiogram to ensure such values as vena contract measurements, regurgitant fraction, and regurgitant volume are obtained.  If she indeed has reduced left ventricular systolic function and severe aortic regurgitation with moderate aortic stenosis, she may be a candidate for TAVR.  2. Hypertension: Mildly elevated but normal at last visit on medical therapy which includes spironolactone and hydrochlorothiazide. Will continue to monitor.    Disposition: Follow up 3 months  Prentice DockerSuresh Savir Blanke, M.D., F.A.C.C.

## 2016-07-06 NOTE — Patient Instructions (Signed)
Medication Instructions:  Your physician recommends that you continue on your current medications as directed. Please refer to the Current Medication list given to you today.  Labwork: NONE  Testing/Procedures: NONE  Follow-Up: Your physician recommends that you schedule a follow-up appointment in: 3 MONTHS WITH DR. KONESWARAN  Any Other Special Instructions Will Be Listed Below (If Applicable).  If you need a refill on your cardiac medications before your next appointment, please call your pharmacy. 

## 2016-07-06 NOTE — Addendum Note (Signed)
Addended by: Norva PavlovJOYCE, Belia Febo on: 07/06/2016 12:48 PM   Modules accepted: Orders

## 2016-07-06 NOTE — Telephone Encounter (Signed)
Pre-cert Verification for the following procedure    Echo scheduled [facility]07/2016 at [facility] per Dr. Purvis SheffieldKoneswaran needs asap.

## 2016-07-07 ENCOUNTER — Ambulatory Visit (HOSPITAL_COMMUNITY): Admission: RE | Admit: 2016-07-07 | Payer: Medicare HMO | Source: Ambulatory Visit

## 2016-08-05 DIAGNOSIS — S0093XA Contusion of unspecified part of head, initial encounter: Secondary | ICD-10-CM | POA: Diagnosis not present

## 2016-08-05 DIAGNOSIS — S6000XA Contusion of unspecified finger without damage to nail, initial encounter: Secondary | ICD-10-CM | POA: Diagnosis not present

## 2016-08-10 DIAGNOSIS — R4789 Other speech disturbances: Secondary | ICD-10-CM | POA: Diagnosis not present

## 2016-08-10 DIAGNOSIS — S060X0D Concussion without loss of consciousness, subsequent encounter: Secondary | ICD-10-CM | POA: Diagnosis not present

## 2016-08-10 DIAGNOSIS — R4702 Dysphasia: Secondary | ICD-10-CM | POA: Diagnosis not present

## 2016-08-12 ENCOUNTER — Encounter (HOSPITAL_COMMUNITY): Payer: Self-pay | Admitting: Emergency Medicine

## 2016-08-12 ENCOUNTER — Emergency Department (HOSPITAL_COMMUNITY)
Admission: EM | Admit: 2016-08-12 | Discharge: 2016-08-13 | Disposition: A | Discharge status: Home / Self Care | Payer: Medicare HMO | Attending: Emergency Medicine | Admitting: Emergency Medicine

## 2016-08-12 ENCOUNTER — Emergency Department (HOSPITAL_COMMUNITY): Payer: Medicare HMO

## 2016-08-12 DIAGNOSIS — S065X0A Traumatic subdural hemorrhage without loss of consciousness, initial encounter: Secondary | ICD-10-CM | POA: Insufficient documentation

## 2016-08-12 DIAGNOSIS — Y929 Unspecified place or not applicable: Secondary | ICD-10-CM | POA: Diagnosis not present

## 2016-08-12 DIAGNOSIS — Z7982 Long term (current) use of aspirin: Secondary | ICD-10-CM | POA: Diagnosis not present

## 2016-08-12 DIAGNOSIS — W1830XA Fall on same level, unspecified, initial encounter: Secondary | ICD-10-CM | POA: Diagnosis not present

## 2016-08-12 DIAGNOSIS — G319 Degenerative disease of nervous system, unspecified: Secondary | ICD-10-CM | POA: Diagnosis not present

## 2016-08-12 DIAGNOSIS — S098XXA Other specified injuries of head, initial encounter: Secondary | ICD-10-CM | POA: Diagnosis present

## 2016-08-12 DIAGNOSIS — R296 Repeated falls: Secondary | ICD-10-CM | POA: Diagnosis not present

## 2016-08-12 DIAGNOSIS — Y999 Unspecified external cause status: Secondary | ICD-10-CM | POA: Diagnosis not present

## 2016-08-12 DIAGNOSIS — S065X9A Traumatic subdural hemorrhage with loss of consciousness of unspecified duration, initial encounter: Secondary | ICD-10-CM

## 2016-08-12 DIAGNOSIS — I5032 Chronic diastolic (congestive) heart failure: Secondary | ICD-10-CM | POA: Insufficient documentation

## 2016-08-12 DIAGNOSIS — Z9181 History of falling: Secondary | ICD-10-CM | POA: Diagnosis not present

## 2016-08-12 DIAGNOSIS — R4789 Other speech disturbances: Secondary | ICD-10-CM | POA: Diagnosis not present

## 2016-08-12 DIAGNOSIS — S0083XA Contusion of other part of head, initial encounter: Secondary | ICD-10-CM | POA: Diagnosis not present

## 2016-08-12 DIAGNOSIS — R4702 Dysphasia: Secondary | ICD-10-CM | POA: Diagnosis not present

## 2016-08-12 DIAGNOSIS — Y939 Activity, unspecified: Secondary | ICD-10-CM | POA: Insufficient documentation

## 2016-08-12 DIAGNOSIS — Z79899 Other long term (current) drug therapy: Secondary | ICD-10-CM | POA: Insufficient documentation

## 2016-08-12 DIAGNOSIS — S0990XA Unspecified injury of head, initial encounter: Secondary | ICD-10-CM | POA: Diagnosis not present

## 2016-08-12 DIAGNOSIS — S065XAA Traumatic subdural hemorrhage with loss of consciousness status unknown, initial encounter: Secondary | ICD-10-CM

## 2016-08-12 DIAGNOSIS — S060X0D Concussion without loss of consciousness, subsequent encounter: Secondary | ICD-10-CM | POA: Diagnosis not present

## 2016-08-12 LAB — COMPREHENSIVE METABOLIC PANEL
ALBUMIN: 2.6 g/dL — AB (ref 3.5–5.0)
ALT: 15 U/L (ref 14–54)
AST: 12 U/L — AB (ref 15–41)
Alkaline Phosphatase: 62 U/L (ref 38–126)
Anion gap: 9 (ref 5–15)
BUN: 22 mg/dL — AB (ref 6–20)
CHLORIDE: 96 mmol/L — AB (ref 101–111)
CO2: 27 mmol/L (ref 22–32)
CREATININE: 0.82 mg/dL (ref 0.44–1.00)
Calcium: 9.1 mg/dL (ref 8.9–10.3)
GFR calc non Af Amer: >60 mL/min (ref >60)
GLUCOSE: 139 mg/dL — AB (ref 65–99)
Potassium: 3.6 mmol/L (ref 3.5–5.1)
SODIUM: 132 mmol/L — AB (ref 135–145)
Total Bilirubin: 0.5 mg/dL (ref 0.3–1.2)
Total Protein: 5.7 g/dL — ABNORMAL LOW (ref 6.5–8.1)

## 2016-08-12 LAB — PROTIME-INR
INR: 1.09
Prothrombin Time: 14.1 seconds (ref 11.4–15.2)

## 2016-08-12 LAB — CBC WITH DIFFERENTIAL/PLATELET
BASOS ABS: 0 10*3/uL (ref 0.0–0.1)
BASOS PCT: 0 %
EOS ABS: 0 10*3/uL (ref 0.0–0.7)
Eosinophils Relative: 1 %
HEMATOCRIT: 31 % — AB (ref 36.0–46.0)
HEMOGLOBIN: 9.8 g/dL — AB (ref 12.0–15.0)
Lymphocytes Relative: 17 %
Lymphs Abs: 1.2 10*3/uL (ref 0.7–4.0)
MCH: 24.7 pg — ABNORMAL LOW (ref 26.0–34.0)
MCHC: 31.6 g/dL (ref 30.0–36.0)
MCV: 78.1 fL (ref 78.0–100.0)
Monocytes Absolute: 1.1 10*3/uL — ABNORMAL HIGH (ref 0.1–1.0)
Monocytes Relative: 15 %
NEUTROS ABS: 5 10*3/uL (ref 1.7–7.7)
NEUTROS PCT: 67 %
Platelets: 298 10*3/uL (ref 150–400)
RBC: 3.97 MIL/uL (ref 3.87–5.11)
RDW: 17.1 % — AB (ref 11.5–15.5)
WBC: 7.4 10*3/uL (ref 4.0–10.5)

## 2016-08-12 MED ORDER — ACETAMINOPHEN 325 MG PO TABS
650.0000 mg | ORAL_TABLET | Freq: Once | ORAL | Status: DC
  Filled 2016-08-12: qty 2

## 2016-08-12 NOTE — ED Provider Notes (Signed)
[facility] DEPT Provider Note   CSN: [id number] Arrival date & time: 08/12/16  1923     History   Chief Complaint Chief Complaint  Patient presents with  . Abnormal Lab  . Head Injury    HPI Karla Dunn is a 81 y.o. female.  This a frail 81 year old female who's been using a walker or cane for ambulation for the past several months.  He had a fall on July 4, landing on her right side, hitting the right side temporal area on the ground.  She was seen by her PCP on Friday July 7, who stated that she had a concussion after the physical examination.  Family noticed that the following Sunday, which would've been July 9 that she started having some slurred speech and her ambulation was worsening.  She was seen by her primary care doctor July 11th, who ordered a CT of her head that she had today and she was called.  I the doctor to go to [facility] because of "blood on her brain.".      Past Medical History:  Diagnosis Date  . Chronic diastolic heart failure (HCC)   . Nonrheumatic aortic valve insufficiency     Patient Active Problem List   Diagnosis Date Noted  . Chronic diastolic heart failure [facility])     Past Surgical History:  Procedure Laterality Date  . CATARACT EXTRACTION  11/17/2010  . COLONOSCOPY  05/2009  . OTHER SURGICAL HISTORY  09/2014   hysterectomy wtih rt leimyoma removal   . RHINOPLASTY  1986    OB History    No data available       Home Medications    Prior to Admission medications   Medication Sig Start Date End Date Taking? Authorizing Provider  aspirin EC 81 MG tablet Take 81 mg by mouth daily.   Yes [provider]  buPROPion (WELLBUTRIN) 75 MG tablet Take 75 mg by mouth 3 (three) times daily.   Yes [provider]  Calcium Carbonate-Vit D-Min (CALTRATE 600+D PLUS MINERALS) 600-800 MG-UNIT TABS Take 1 tablet by mouth 2 (two) times daily.   Yes [provider]  hydroxychloroquine (PLAQUENIL) 200 MG tablet  Take 1 tablet by mouth daily. 05/13/16  Yes [provider]  Multiple Vitamins-Minerals (PRESERVISION AREDS PO) Take 1 tablet by mouth daily.   Yes [provider]  omeprazole (PRILOSEC OTC) 20 MG tablet Take 20 mg by mouth daily.   Yes [provider]  PARoxetine (PAXIL) 20 MG tablet Take 20 mg by mouth 2 (two) times daily.    Yes [provider]  Polyethyl Glycol-Propyl Glycol (SYSTANE) 0.4-0.3 % GEL ophthalmic gel Place 1 application into both eyes as needed (dry eye).   Yes [provider]  polyethylene glycol (MIRALAX / GLYCOLAX) packet Take 17 g by mouth daily.   Yes [provider]  spironolactone-hydrochlorothiazide (ALDACTAZIDE) 25-25 MG tablet Take 1 tablet by mouth daily.   Yes [provider]  temazepam (RESTORIL) 15 MG capsule Take 30 mg by mouth at bedtime.    Yes [provider]  albuterol (PROAIR HFA) 108 (90 Base) MCG/ACT inhaler Inhale into the lungs every 6 (six) hours as needed for wheezing or shortness of breath.    [provider]  levETIRAcetam (KEPPRA) 500 MG tablet Take 1 tablet (500 mg total) by mouth 2 (two) times daily. 08/13/16   Costella, Darci Current, PA-C    Family History Family History  Problem Relation Age of Onset  .  Depression Mother   . Alcohol abuse Father   . Depression Maternal Aunt   . Alcohol abuse Paternal Uncle   . COPD Maternal Grandfather   . Alzheimer's disease Paternal Grandmother     Social History Social History  Substance Use Topics  . Smoking status: Never Smoker  . Smokeless tobacco: Never Used  . Alcohol use No     Allergies   Iron complex [chromagen]   Review of Systems Review of Systems  Constitutional: Negative for fever.  Eyes: Negative for visual disturbance.  Musculoskeletal: Positive for gait problem.  Skin: Positive for color change.  Neurological: Positive for speech difficulty and headaches. Negative for dizziness.  All other systems  reviewed and are negative.    Physical Exam Updated Vital Signs BP (!) 111/48   Pulse 73   Temp 98.3 F (36.8 C)   Resp (!) 26   Ht 5\' 3"  (1.6 m)   Wt 42.2 kg (93 lb)   SpO2 98%   BMI 16.47 kg/m   Physical Exam  Constitutional: She appears well-developed and well-nourished.  HENT:  Head: Normocephalic.    Eyes: Pupils are equal, round, and reactive to light.  Neck: Normal range of motion.  Cardiovascular: Normal rate.   Pulmonary/Chest: Effort normal.  Abdominal: Soft.  Skin: Skin is warm and dry.  Psychiatric: She has a normal mood and affect.  Nursing note and vitals reviewed.    ED Treatments / Results  Labs (all labs ordered are listed, but only abnormal results are displayed) Labs Reviewed  CBC WITH DIFFERENTIAL/PLATELET - Abnormal; Notable for the following:       Result Value   Hemoglobin 9.8 (*)    HCT 31.0 (*)    MCH 24.7 (*)    RDW 17.1 (*)    Monocytes Absolute 1.1 (*)    All other components within normal limits  COMPREHENSIVE METABOLIC PANEL - Abnormal; Notable for the following:    Sodium 132 (*)    Chloride 96 (*)    Glucose, Bld 139 (*)    BUN 22 (*)    Total Protein 5.7 (*)    Albumin 2.6 (*)    AST 12 (*)    All other components within normal limits  PROTIME-INR    EKG  EKG Interpretation None       Radiology Ct Head Wo Contrast  Result Date: 08/12/2016 CLINICAL DATA:  81 year old female with fall and right forehead hematoma. Right-sided subdural hemorrhage. EXAM: CT HEAD WITHOUT CONTRAST TECHNIQUE: Contiguous axial images were obtained from the base of the skull through the vertex without intravenous contrast. COMPARISON:  Earlier Head CT dated 08/12/2016 FINDINGS: Brain: No significant interval change in the size of the acute on chronic right hemispheric subdural hemorrhage. The acute component of the hemorrhage measures approximately 9 mm over the right frontal convexity. There is associated mild mass effect and approximately  3-4 mm right-to-left midline shift (measured at the level of the foramen Monro). Minimal subarachnoid hemorrhage also appears stable. No new hemorrhage noted. There is mild chronic microvascular ischemic changes. Vascular: No hyperdense vessel or unexpected calcification. Skull: Normal. Negative for fracture or focal lesion. Sinuses/Orbits: No acute finding. Other: None IMPRESSION: No significant interval change in the size of the right hemispheric subdural hemorrhage or in the degree of midline shift compared to the most recent CT. No new hemorrhage. Continued follow-up recommended. Electronically Signed   By: Elgie Collard M.D.   On: 08/12/2016 23:30    Procedures Procedures (including critical  care time)  Medications Ordered in ED Medications  acetaminophen (TYLENOL) suppository 650 mg (650 mg Rectal Given 08/13/16 0026)     Initial Impression / Assessment and Plan / ED Course  I have reviewed the triage vital signs and the nursing notes.  Pertinent labs & imaging results that were available during my care of the patient were reviewed by me and considered in my medical decision making (see chart for details).      Neurosurgery to bedside for evaluation  They will start Keppra and arrange follow up in office Patient has been given return parameters with understanding  Final Clinical Impressions(s) / ED Diagnoses   Final diagnoses:  Injury of head, initial encounter  Subdural hematoma (HCC)    New Prescriptions Current Discharge Medication List    START taking these medications   Details  levETIRAcetam (KEPPRA) 500 MG tablet Take 1 tablet (500 mg total) by mouth 2 (two) times daily. Qty: 14 tablet, Refills: 0         Earley FavorSchulz, Janelle Culton, NP 08/13/16 [id number]    Geoffery Lyonselo, Douglas, MD 08/14/16 [phone]

## 2016-08-12 NOTE — ED Triage Notes (Signed)
Pt presents with family for concerns for slurred speech and dysphagia since Sunday; family reports pt fell on 7/4 and was seen by PCP dx with concussion; on Sunday pt began experiencing slurred speech and dysphagia; [facility] which showed" blood on brain"

## 2016-08-13 DIAGNOSIS — I6201 Nontraumatic acute subdural hemorrhage: Secondary | ICD-10-CM | POA: Diagnosis not present

## 2016-08-13 DIAGNOSIS — I6203 Nontraumatic chronic subdural hemorrhage: Secondary | ICD-10-CM | POA: Diagnosis not present

## 2016-08-13 MED ORDER — LEVETIRACETAM 500 MG PO TABS: 500.0000 mg | ORAL_TABLET | Freq: Two times a day (BID) | ORAL | 0 refills | Status: DC

## 2016-08-13 MED ORDER — ACETAMINOPHEN 650 MG RE SUPP
650.0000 mg | Freq: Once | RECTAL | Status: AC
  Administered 2016-08-13: 650 mg via RECTAL
  Filled 2016-08-13: qty 1

## 2016-08-13 NOTE — Consult Note (Signed)
CC:  Chief Complaint  Patient presents with  . Abnormal Lab  . Head Injury    HPI: Karla Dunn is a 81 y.o. female who presented to ER after her PCP ordered a head CT revealing SDH. Patient is present with family who also give history. Mechanical fall July 4th striking head. No LOC. No seizure like activity. Went to PCP and diagnosed with concussion & advised to continue to monitor symptoms. Son reports patient started slurring her words several days ago so they went back to her PCP who ordered a CT scan which was done 08/12/2016 at approx 1130am. CT revealed approx 9mm acute on chronic SDH with 3-20mm midline shift. Patient was called at 1630 on 7/13 and advised to go to ER for evaluation. Feels well with exception of headache, dull, mild in severity. No changes in vision. Generalized weakness without focal deficits. Issues with dysphagia since fall, but does report this is chronic and has had multiple esophageal dilations. She is no on anti-coagulation.   PMH: Past Medical History:  Diagnosis Date  . Chronic diastolic heart failure (HCC)   . Nonrheumatic aortic valve insufficiency     PSH: Past Surgical History:  Procedure Laterality Date  . CATARACT EXTRACTION  11/17/2010  . COLONOSCOPY  05/2009  . OTHER SURGICAL HISTORY  09/2014   hysterectomy wtih rt leimyoma removal   . RHINOPLASTY  1986    SH: Social History  Substance Use Topics  . Smoking status: Never Smoker  . Smokeless tobacco: Never Used  . Alcohol use No    MEDS: Prior to Admission medications   Medication Sig Start Date End Date Taking? Authorizing Provider  aspirin EC 81 MG tablet Take 81 mg by mouth daily.   Yes [provider]  buPROPion (WELLBUTRIN) 75 MG tablet Take 75 mg by mouth 3 (three) times daily.   Yes [provider]  Calcium Carbonate-Vit D-Min (CALTRATE 600+D PLUS MINERALS) 600-800 MG-UNIT TABS Take 1 tablet by mouth 2 (two) times daily.   Yes [provider]   hydroxychloroquine (PLAQUENIL) 200 MG tablet Take 1 tablet by mouth daily. 05/13/16  Yes [provider]  Multiple Vitamins-Minerals (PRESERVISION AREDS PO) Take 1 tablet by mouth daily.   Yes [provider]  omeprazole (PRILOSEC OTC) 20 MG tablet Take 20 mg by mouth daily.   Yes [provider]  PARoxetine (PAXIL) 20 MG tablet Take 20 mg by mouth 2 (two) times daily.    Yes [provider]  Polyethyl Glycol-Propyl Glycol (SYSTANE) 0.4-0.3 % GEL ophthalmic gel Place 1 application into both eyes as needed (dry eye).   Yes [provider]  polyethylene glycol (MIRALAX / GLYCOLAX) packet Take 17 g by mouth daily.   Yes [provider]  spironolactone-hydrochlorothiazide (ALDACTAZIDE) 25-25 MG tablet Take 1 tablet by mouth daily.   Yes [provider]  temazepam (RESTORIL) 15 MG capsule Take 30 mg by mouth at bedtime.    Yes [provider]  albuterol (PROAIR HFA) 108 (90 Base) MCG/ACT inhaler Inhale into the lungs every 6 (six) hours as needed for wheezing or shortness of breath.    [provider]    ALLERGY: Allergies  Allergen Reactions  . Iron Complex [Chromagen]     Oral forms causes Nausea but can take the IV forms    ROS: Review of Systems  HENT: Negative.   Eyes: Negative.   Respiratory: Negative.   Cardiovascular: Negative.   Gastrointestinal: Negative for nausea and vomiting.  Genitourinary: Negative.   Neurological: Positive for weakness (general - chronic ) and headaches. Negative for dizziness, tingling, tremors, sensory change, speech change, focal weakness, seizures and loss of consciousness.    Vitals:   08/12/16 2230 08/13/16 0006  BP: (!) 111/48   Pulse: 73   Resp: (!) 26   Temp:  98.3 F (36.8 C)   General appearance: ederly white female, resting comfortably, NAD Eyes: PERRL Cardiovascular: Regular rate and rhythm without murmurs, rubs, gallops. No edema or variciosities. Distal  pulses normal. Pulmonary: Clear to auscultation Musculoskeletal:     Muscle tone upper extremities: Normal    Muscle tone lower extremities: Normal    Motor exam: Upper Extremities Deltoid Bicep Tricep Grip  Right 5/5 5/5 5/5 5/5  Left 5/5 5/5 5/5 5/5   Lower Extremity IP Quad PF DF EHL  Right 5/5 5/5 5/5 5/5 5/5  Left 5/5 5/5 5/5 5/5 5/5   Neurological Awake, alert, oriented Memory and concentration grossly intact Speech appropriate, no real slurring of speech CNII: Visual fields normal CNIII/IV/VI: EOMI CNV: Facial sensation normal CNVII: Symmetric, normal strength CNVIII: Grossly normal CNIX: Normal palate movement CNXI: Trap and SCM strength normal CN XII: Tongue protrusion normal Sensation grossly intact to LT DTR: Normal Coordination (finger/nose & heel/shin): Normal  IMAGING: CT HEAD IMPRESSION: No significant interval change in the size of the right hemispheric subdural hemorrhage or in the degree of midline shift compared to the most recent CT. No new hemorrhage. Continued follow-up recommended.  IMPRESSION/PLAN - 81 y.o. female with acute on chronic SDH with 3mm of midline shift. She had a CT head around 1130am 7/13 and [facility] which was stable. She is neurologically intact. I discussed continued observation vs discharge with family at bedside at length. Based on fall >1 week ago and stable head CT without change in neuro exam, I believe patient is safe to be discharged home. She has good family support that can monitor her. We discussed red flag symptoms at length for when to seek urgent medical attention. Discharged on Keppra 500mg  BID x7days for seizure prophylaxis. F/U outpt in 2-4 weeks.  Call for any concerns.

## 2016-08-13 NOTE — Discharge Instructions (Signed)
Call the office on Monday to arrange for follow up in 2-4 weeks  A prescription for Keppra has been sent to your pharmacy please start this medication tomorrow. If you develop new symptoms please return for further evaluation

## 2016-08-24 DIAGNOSIS — S065X9A Traumatic subdural hemorrhage with loss of consciousness of unspecified duration, initial encounter: Secondary | ICD-10-CM | POA: Diagnosis not present

## 2016-08-24 DIAGNOSIS — Z681 Body mass index (BMI) 19 or less, adult: Secondary | ICD-10-CM | POA: Diagnosis not present

## 2016-08-26 DIAGNOSIS — N182 Chronic kidney disease, stage 2 (mild): Secondary | ICD-10-CM | POA: Diagnosis not present

## 2016-08-26 DIAGNOSIS — Z3401 Encounter for supervision of normal first pregnancy, first trimester: Secondary | ICD-10-CM | POA: Diagnosis not present

## 2016-08-26 DIAGNOSIS — R410 Disorientation, unspecified: Secondary | ICD-10-CM | POA: Diagnosis not present

## 2016-08-29 ENCOUNTER — Other Ambulatory Visit: Payer: Self-pay | Admitting: Physician Assistant

## 2016-08-29 DIAGNOSIS — S065X9A Traumatic subdural hemorrhage with loss of consciousness of unspecified duration, initial encounter: Secondary | ICD-10-CM

## 2016-08-29 DIAGNOSIS — S065XAA Traumatic subdural hemorrhage with loss of consciousness status unknown, initial encounter: Secondary | ICD-10-CM

## 2016-09-08 ENCOUNTER — Other Ambulatory Visit: Payer: Medicare HMO

## 2016-09-12 ENCOUNTER — Ambulatory Visit
Admission: RE | Admit: 2016-09-12 | Discharge: 2016-09-12 | Disposition: A | Discharge status: Home / Self Care | Payer: Medicare HMO | Source: Ambulatory Visit | Attending: Physician Assistant | Admitting: Physician Assistant

## 2016-09-12 DIAGNOSIS — S065X9A Traumatic subdural hemorrhage with loss of consciousness of unspecified duration, initial encounter: Secondary | ICD-10-CM | POA: Diagnosis not present

## 2016-09-12 DIAGNOSIS — S065XAA Traumatic subdural hemorrhage with loss of consciousness status unknown, initial encounter: Secondary | ICD-10-CM

## 2016-09-12 DIAGNOSIS — S065X0A Traumatic subdural hemorrhage without loss of consciousness, initial encounter: Secondary | ICD-10-CM | POA: Diagnosis not present

## 2016-10-06 ENCOUNTER — Encounter: Payer: Self-pay | Admitting: Cardiovascular Disease

## 2016-10-06 ENCOUNTER — Ambulatory Visit (INDEPENDENT_AMBULATORY_CARE_PROVIDER_SITE_OTHER): Payer: Medicare HMO | Admitting: Cardiovascular Disease

## 2016-10-06 VITALS — BP 118/58 | HR 74 | Ht 63.0 in | Wt 94.8 lb

## 2016-10-06 DIAGNOSIS — R5383 Other fatigue: Secondary | ICD-10-CM

## 2016-10-06 DIAGNOSIS — R531 Weakness: Secondary | ICD-10-CM

## 2016-10-06 DIAGNOSIS — I350 Nonrheumatic aortic (valve) stenosis: Secondary | ICD-10-CM | POA: Diagnosis not present

## 2016-10-06 DIAGNOSIS — I359 Nonrheumatic aortic valve disorder, unspecified: Secondary | ICD-10-CM

## 2016-10-06 DIAGNOSIS — I10 Essential (primary) hypertension: Secondary | ICD-10-CM

## 2016-10-06 DIAGNOSIS — I351 Nonrheumatic aortic (valve) insufficiency: Secondary | ICD-10-CM

## 2016-10-06 NOTE — Progress Notes (Signed)
SUBJECTIVE: The patient presents for follow-up of aortic valve disease with both aortic stenosis and aortic regurgitation. I ordered an echocardiogram at her last visit on June 6 but it does not appear to have been completed.  She denies leg swelling, orthopnea, chest pain, and paroxysmal nocturnal dyspnea.  She continues to get short of breath after taking a shower and tires easily.  She had a fall on July 4 and developed a subdural hemorrhage. I reviewed the CT reports from 7/13 and 09/12/16. At that time though right subdural hemorrhage had resolved.   Review of Systems: As per "subjective", otherwise negative.  Allergies  Allergen Reactions  . Iron Complex [Chromagen]     Oral forms causes Nausea but can take the IV forms    Current Outpatient Prescriptions  Medication Sig Dispense Refill  . albuterol (PROAIR HFA) 108 (90 Base) MCG/ACT inhaler Inhale into the lungs every 6 (six) hours as needed for wheezing or shortness of breath.    Marland Kitchen. aspirin EC 81 MG tablet Take 81 mg by mouth daily.    Marland Kitchen. buPROPion (WELLBUTRIN) 75 MG tablet Take 75 mg by mouth 3 (three) times daily.    . Calcium Carbonate-Vit D-Min (CALTRATE 600+D PLUS MINERALS) 600-800 MG-UNIT TABS Take 1 tablet by mouth 2 (two) times daily.    . hydroxychloroquine (PLAQUENIL) 200 MG tablet Take 1 tablet by mouth daily.    . Multiple Vitamins-Minerals (PRESERVISION AREDS PO) Take 1 tablet by mouth daily.    Marland Kitchen. omeprazole (PRILOSEC OTC) 20 MG tablet Take 20 mg by mouth daily.    Marland Kitchen. PARoxetine (PAXIL) 20 MG tablet Take 20 mg by mouth 2 (two) times daily.     Bertram Gala. Polyethyl Glycol-Propyl Glycol (SYSTANE) 0.4-0.3 % GEL ophthalmic gel Place 1 application into both eyes as needed (dry eye).    . polyethylene glycol (MIRALAX / GLYCOLAX) packet Take 17 g by mouth daily.    Marland Kitchen. spironolactone-hydrochlorothiazide (ALDACTAZIDE) 25-25 MG tablet Take 1 tablet by mouth daily.    . temazepam (RESTORIL) 15 MG capsule Take 30 mg by mouth at  bedtime.      No current facility-administered medications for this visit.     Past Medical History:  Diagnosis Date  . Chronic diastolic heart failure (HCC)   . Nonrheumatic aortic valve insufficiency     Past Surgical History:  Procedure Laterality Date  . CATARACT EXTRACTION  11/17/2010  . COLONOSCOPY  05/2009  . OTHER SURGICAL HISTORY  09/2014   hysterectomy wtih rt leimyoma removal   . RHINOPLASTY  1986    Social History   Social History  . Marital status: Widowed    Spouse name: N/A  . Number of children: N/A  . Years of education: N/A   Occupational History  . Not on file.   Social History Main Topics  . Smoking status: Never Smoker  . Smokeless tobacco: Never Used  . Alcohol use No  . Drug use: No  . Sexual activity: Not on file   Other Topics Concern  . Not on file   Social History Narrative  . No narrative on file     Vitals:   10/06/16 1501  BP: (!) 118/58  Pulse: 74  SpO2: 98%  Weight: 94 lb 12.8 oz (43 kg)  Height: 5\' 3"  (1.6 m)    Wt Readings from Last 3 Encounters:  10/06/16 94 lb 12.8 oz (43 kg)  08/12/16 93 lb (42.2 kg)  07/06/16 93 lb (42.2 kg)  PHYSICAL EXAM General: NAD HEENT: Normal. Neck: No JVD, no thyromegaly. Lungs: Clear to auscultation bilaterally with normal respiratory effort. CV: Regular rate and rhythm, normal S1/S2, no S3/S4, 2/6 systolicmurmur over RUSB, 1/4 holodiastolic murmur along left lower sternal border. No peripheral edema.  Abdomen: Soft, nontender, no distention.  Neurologic: Alert and oriented.  Psych: Normal affect. Skin: Normal. Musculoskeletal: No gross deformities.    ECG: Most recent ECG reviewed.   Labs: Lab Results  Component Value Date/Time   K 3.6 08/12/2016 07:56 PM   BUN 22 (H) 08/12/2016 07:56 PM   CREATININE 0.82 08/12/2016 07:56 PM   ALT 15 08/12/2016 07:56 PM   HGB 9.8 (L) 08/12/2016 07:56 PM     Lipids: No results found for: LDLCALC, LDLDIRECT, CHOL, TRIG, HDL      ASSESSMENT AND PLAN:  1. Aortic valve disease with moderate stenosis and reportedly moderate to severe regurgitation:  I will again try to obtain a repeat echocardiogram to ensure such values as vena contract measurements, regurgitant fraction, and regurgitant volume are obtained.  If she indeed has reduced left ventricular systolic function and severe aortic regurgitation with moderate aortic stenosis, she may be a candidate for TAVR.  2. Hypertension:Controlled. No changes.     Disposition: Follow up January 2019   Prentice Docker, M.D., F.A.C.C.

## 2016-10-06 NOTE — Patient Instructions (Signed)
Medication Instructions:  Continue all current medications.  Labwork: none  Testing/Procedures: Your physician has requested that you have an echocardiogram. Echocardiography is a painless test that uses sound waves to create images of your heart. It provides your doctor with information about the size and shape of your heart and how well your heart's chambers and valves are working. This procedure takes approximately one hour. There are no restrictions for this procedure. Office will contact with results via phone or letter.     Follow-Up: 4 months   Any Other Special Instructions Will Be Listed Below (If Applicable).   If you need a refill on your cardiac medications before your next appointment, please call your pharmacy.  

## 2016-10-19 ENCOUNTER — Other Ambulatory Visit: Payer: Self-pay

## 2016-10-19 ENCOUNTER — Ambulatory Visit (INDEPENDENT_AMBULATORY_CARE_PROVIDER_SITE_OTHER): Payer: Medicare HMO

## 2016-10-19 DIAGNOSIS — I350 Nonrheumatic aortic (valve) stenosis: Secondary | ICD-10-CM

## 2016-10-19 DIAGNOSIS — I351 Nonrheumatic aortic (valve) insufficiency: Secondary | ICD-10-CM | POA: Diagnosis not present

## 2016-10-26 ENCOUNTER — Telehealth: Payer: Self-pay | Admitting: Cardiovascular Disease

## 2016-10-26 NOTE — Telephone Encounter (Signed)
Notes recordedLesle Chrisngela G, LPN on 1/61/0960 at 11:41 AM EDT Spero Geralds (caregiver) notified. Copy to pmd. ------  Notes recorded by Lesle Chris, LPN on 4/54/0981 at 1:00 PM EDT No answer.  ------  Notes recorded by Laqueta Linden, MD on 10/19/2016 at 4:46 PM EDT Normal pumping function with moderate aortic valve leakage. Will treat with medications.

## 2016-10-26 NOTE — Telephone Encounter (Signed)
Asking for results from test 

## 2017-02-02 DIAGNOSIS — E1169 Type 2 diabetes mellitus with other specified complication: Secondary | ICD-10-CM | POA: Diagnosis not present

## 2017-02-02 DIAGNOSIS — R5383 Other fatigue: Secondary | ICD-10-CM | POA: Diagnosis not present

## 2017-02-02 DIAGNOSIS — N182 Chronic kidney disease, stage 2 (mild): Secondary | ICD-10-CM | POA: Diagnosis not present

## 2017-02-02 DIAGNOSIS — D509 Iron deficiency anemia, unspecified: Secondary | ICD-10-CM | POA: Diagnosis not present

## 2017-02-02 DIAGNOSIS — J019 Acute sinusitis, unspecified: Secondary | ICD-10-CM | POA: Diagnosis not present

## 2017-02-02 DIAGNOSIS — I10 Essential (primary) hypertension: Secondary | ICD-10-CM | POA: Diagnosis not present

## 2017-02-02 DIAGNOSIS — Z681 Body mass index (BMI) 19 or less, adult: Secondary | ICD-10-CM | POA: Diagnosis not present

## 2017-04-17 DIAGNOSIS — I10 Essential (primary) hypertension: Secondary | ICD-10-CM | POA: Diagnosis not present

## 2017-04-17 DIAGNOSIS — R5383 Other fatigue: Secondary | ICD-10-CM | POA: Diagnosis not present

## 2017-04-17 DIAGNOSIS — D509 Iron deficiency anemia, unspecified: Secondary | ICD-10-CM | POA: Diagnosis not present

## 2017-04-17 DIAGNOSIS — E1169 Type 2 diabetes mellitus with other specified complication: Secondary | ICD-10-CM | POA: Diagnosis not present

## 2017-04-17 DIAGNOSIS — K210 Gastro-esophageal reflux disease with esophagitis: Secondary | ICD-10-CM | POA: Diagnosis not present

## 2017-04-17 DIAGNOSIS — N182 Chronic kidney disease, stage 2 (mild): Secondary | ICD-10-CM | POA: Diagnosis not present

## 2017-04-17 DIAGNOSIS — R69 Illness, unspecified: Secondary | ICD-10-CM | POA: Diagnosis not present

## 2017-04-17 DIAGNOSIS — E559 Vitamin D deficiency, unspecified: Secondary | ICD-10-CM | POA: Diagnosis not present

## 2017-04-17 DIAGNOSIS — M545 Low back pain: Secondary | ICD-10-CM | POA: Diagnosis not present

## 2017-04-17 DIAGNOSIS — I5032 Chronic diastolic (congestive) heart failure: Secondary | ICD-10-CM | POA: Diagnosis not present

## 2017-04-20 DIAGNOSIS — I10 Essential (primary) hypertension: Secondary | ICD-10-CM | POA: Diagnosis not present

## 2017-04-20 DIAGNOSIS — R2681 Unsteadiness on feet: Secondary | ICD-10-CM | POA: Diagnosis not present

## 2017-04-20 DIAGNOSIS — N182 Chronic kidney disease, stage 2 (mild): Secondary | ICD-10-CM | POA: Diagnosis not present

## 2017-04-20 DIAGNOSIS — I5032 Chronic diastolic (congestive) heart failure: Secondary | ICD-10-CM | POA: Diagnosis not present

## 2017-04-20 DIAGNOSIS — E1169 Type 2 diabetes mellitus with other specified complication: Secondary | ICD-10-CM | POA: Diagnosis not present

## 2017-04-20 DIAGNOSIS — R69 Illness, unspecified: Secondary | ICD-10-CM | POA: Diagnosis not present

## 2017-04-20 DIAGNOSIS — N3946 Mixed incontinence: Secondary | ICD-10-CM | POA: Diagnosis not present

## 2017-04-20 DIAGNOSIS — Z681 Body mass index (BMI) 19 or less, adult: Secondary | ICD-10-CM | POA: Diagnosis not present

## 2017-07-24 DIAGNOSIS — R5383 Other fatigue: Secondary | ICD-10-CM | POA: Diagnosis not present

## 2017-07-24 DIAGNOSIS — E1169 Type 2 diabetes mellitus with other specified complication: Secondary | ICD-10-CM | POA: Diagnosis not present

## 2017-07-24 DIAGNOSIS — I10 Essential (primary) hypertension: Secondary | ICD-10-CM | POA: Diagnosis not present

## 2017-07-24 DIAGNOSIS — I5032 Chronic diastolic (congestive) heart failure: Secondary | ICD-10-CM | POA: Diagnosis not present

## 2017-07-24 DIAGNOSIS — K210 Gastro-esophageal reflux disease with esophagitis: Secondary | ICD-10-CM | POA: Diagnosis not present

## 2017-07-24 DIAGNOSIS — N182 Chronic kidney disease, stage 2 (mild): Secondary | ICD-10-CM | POA: Diagnosis not present

## 2017-07-26 DIAGNOSIS — N182 Chronic kidney disease, stage 2 (mild): Secondary | ICD-10-CM | POA: Diagnosis not present

## 2017-07-26 DIAGNOSIS — M3210 Systemic lupus erythematosus, organ or system involvement unspecified: Secondary | ICD-10-CM | POA: Diagnosis not present

## 2017-07-26 DIAGNOSIS — Z0001 Encounter for general adult medical examination with abnormal findings: Secondary | ICD-10-CM | POA: Diagnosis not present

## 2017-07-26 DIAGNOSIS — I10 Essential (primary) hypertension: Secondary | ICD-10-CM | POA: Diagnosis not present

## 2017-07-26 DIAGNOSIS — D509 Iron deficiency anemia, unspecified: Secondary | ICD-10-CM | POA: Diagnosis not present

## 2017-07-26 DIAGNOSIS — R2681 Unsteadiness on feet: Secondary | ICD-10-CM | POA: Diagnosis not present

## 2017-07-26 DIAGNOSIS — E1169 Type 2 diabetes mellitus with other specified complication: Secondary | ICD-10-CM | POA: Diagnosis not present

## 2017-07-26 DIAGNOSIS — I5032 Chronic diastolic (congestive) heart failure: Secondary | ICD-10-CM | POA: Diagnosis not present

## 2017-07-26 DIAGNOSIS — Z681 Body mass index (BMI) 19 or less, adult: Secondary | ICD-10-CM | POA: Diagnosis not present

## 2017-08-01 DIAGNOSIS — M329 Systemic lupus erythematosus, unspecified: Secondary | ICD-10-CM | POA: Diagnosis not present

## 2017-08-01 DIAGNOSIS — D509 Iron deficiency anemia, unspecified: Secondary | ICD-10-CM | POA: Diagnosis not present

## 2017-08-01 DIAGNOSIS — K210 Gastro-esophageal reflux disease with esophagitis: Secondary | ICD-10-CM | POA: Diagnosis not present

## 2017-08-01 DIAGNOSIS — R2681 Unsteadiness on feet: Secondary | ICD-10-CM | POA: Diagnosis not present

## 2017-08-01 DIAGNOSIS — R69 Illness, unspecified: Secondary | ICD-10-CM | POA: Diagnosis not present

## 2017-08-01 DIAGNOSIS — N182 Chronic kidney disease, stage 2 (mild): Secondary | ICD-10-CM | POA: Diagnosis not present

## 2017-08-01 DIAGNOSIS — R2689 Other abnormalities of gait and mobility: Secondary | ICD-10-CM | POA: Diagnosis not present

## 2017-08-01 DIAGNOSIS — I351 Nonrheumatic aortic (valve) insufficiency: Secondary | ICD-10-CM | POA: Diagnosis not present

## 2017-08-01 DIAGNOSIS — I5032 Chronic diastolic (congestive) heart failure: Secondary | ICD-10-CM | POA: Diagnosis not present

## 2017-08-01 DIAGNOSIS — I130 Hypertensive heart and chronic kidney disease with heart failure and stage 1 through stage 4 chronic kidney disease, or unspecified chronic kidney disease: Secondary | ICD-10-CM | POA: Diagnosis not present

## 2017-08-01 DIAGNOSIS — E1122 Type 2 diabetes mellitus with diabetic chronic kidney disease: Secondary | ICD-10-CM | POA: Diagnosis not present

## 2017-08-14 DIAGNOSIS — R2681 Unsteadiness on feet: Secondary | ICD-10-CM | POA: Diagnosis not present

## 2017-08-14 DIAGNOSIS — I130 Hypertensive heart and chronic kidney disease with heart failure and stage 1 through stage 4 chronic kidney disease, or unspecified chronic kidney disease: Secondary | ICD-10-CM | POA: Diagnosis not present

## 2017-08-14 DIAGNOSIS — I5032 Chronic diastolic (congestive) heart failure: Secondary | ICD-10-CM | POA: Diagnosis not present

## 2017-08-14 DIAGNOSIS — R69 Illness, unspecified: Secondary | ICD-10-CM | POA: Diagnosis not present

## 2017-08-14 DIAGNOSIS — K210 Gastro-esophageal reflux disease with esophagitis: Secondary | ICD-10-CM | POA: Diagnosis not present

## 2017-08-14 DIAGNOSIS — M329 Systemic lupus erythematosus, unspecified: Secondary | ICD-10-CM | POA: Diagnosis not present

## 2017-08-14 DIAGNOSIS — E1122 Type 2 diabetes mellitus with diabetic chronic kidney disease: Secondary | ICD-10-CM | POA: Diagnosis not present

## 2017-08-14 DIAGNOSIS — I351 Nonrheumatic aortic (valve) insufficiency: Secondary | ICD-10-CM | POA: Diagnosis not present

## 2017-08-14 DIAGNOSIS — D509 Iron deficiency anemia, unspecified: Secondary | ICD-10-CM | POA: Diagnosis not present

## 2017-08-14 DIAGNOSIS — N182 Chronic kidney disease, stage 2 (mild): Secondary | ICD-10-CM | POA: Diagnosis not present

## 2017-08-16 DIAGNOSIS — R2681 Unsteadiness on feet: Secondary | ICD-10-CM | POA: Diagnosis not present

## 2017-08-16 DIAGNOSIS — M329 Systemic lupus erythematosus, unspecified: Secondary | ICD-10-CM | POA: Diagnosis not present

## 2017-08-16 DIAGNOSIS — D509 Iron deficiency anemia, unspecified: Secondary | ICD-10-CM | POA: Diagnosis not present

## 2017-08-16 DIAGNOSIS — N182 Chronic kidney disease, stage 2 (mild): Secondary | ICD-10-CM | POA: Diagnosis not present

## 2017-08-16 DIAGNOSIS — I351 Nonrheumatic aortic (valve) insufficiency: Secondary | ICD-10-CM | POA: Diagnosis not present

## 2017-08-16 DIAGNOSIS — K210 Gastro-esophageal reflux disease with esophagitis: Secondary | ICD-10-CM | POA: Diagnosis not present

## 2017-08-16 DIAGNOSIS — E1122 Type 2 diabetes mellitus with diabetic chronic kidney disease: Secondary | ICD-10-CM | POA: Diagnosis not present

## 2017-08-16 DIAGNOSIS — I130 Hypertensive heart and chronic kidney disease with heart failure and stage 1 through stage 4 chronic kidney disease, or unspecified chronic kidney disease: Secondary | ICD-10-CM | POA: Diagnosis not present

## 2017-08-16 DIAGNOSIS — I5032 Chronic diastolic (congestive) heart failure: Secondary | ICD-10-CM | POA: Diagnosis not present

## 2017-08-16 DIAGNOSIS — R69 Illness, unspecified: Secondary | ICD-10-CM | POA: Diagnosis not present

## 2017-08-21 DIAGNOSIS — R2681 Unsteadiness on feet: Secondary | ICD-10-CM | POA: Diagnosis not present

## 2017-08-21 DIAGNOSIS — D509 Iron deficiency anemia, unspecified: Secondary | ICD-10-CM | POA: Diagnosis not present

## 2017-08-21 DIAGNOSIS — I5032 Chronic diastolic (congestive) heart failure: Secondary | ICD-10-CM | POA: Diagnosis not present

## 2017-08-21 DIAGNOSIS — I351 Nonrheumatic aortic (valve) insufficiency: Secondary | ICD-10-CM | POA: Diagnosis not present

## 2017-08-21 DIAGNOSIS — E1122 Type 2 diabetes mellitus with diabetic chronic kidney disease: Secondary | ICD-10-CM | POA: Diagnosis not present

## 2017-08-21 DIAGNOSIS — N182 Chronic kidney disease, stage 2 (mild): Secondary | ICD-10-CM | POA: Diagnosis not present

## 2017-08-21 DIAGNOSIS — K210 Gastro-esophageal reflux disease with esophagitis: Secondary | ICD-10-CM | POA: Diagnosis not present

## 2017-08-21 DIAGNOSIS — I130 Hypertensive heart and chronic kidney disease with heart failure and stage 1 through stage 4 chronic kidney disease, or unspecified chronic kidney disease: Secondary | ICD-10-CM | POA: Diagnosis not present

## 2017-08-21 DIAGNOSIS — M329 Systemic lupus erythematosus, unspecified: Secondary | ICD-10-CM | POA: Diagnosis not present

## 2017-08-21 DIAGNOSIS — R69 Illness, unspecified: Secondary | ICD-10-CM | POA: Diagnosis not present

## 2017-08-23 DIAGNOSIS — R2681 Unsteadiness on feet: Secondary | ICD-10-CM | POA: Diagnosis not present

## 2017-08-23 DIAGNOSIS — I130 Hypertensive heart and chronic kidney disease with heart failure and stage 1 through stage 4 chronic kidney disease, or unspecified chronic kidney disease: Secondary | ICD-10-CM | POA: Diagnosis not present

## 2017-08-23 DIAGNOSIS — K210 Gastro-esophageal reflux disease with esophagitis: Secondary | ICD-10-CM | POA: Diagnosis not present

## 2017-08-23 DIAGNOSIS — I5032 Chronic diastolic (congestive) heart failure: Secondary | ICD-10-CM | POA: Diagnosis not present

## 2017-08-23 DIAGNOSIS — M329 Systemic lupus erythematosus, unspecified: Secondary | ICD-10-CM | POA: Diagnosis not present

## 2017-08-23 DIAGNOSIS — D509 Iron deficiency anemia, unspecified: Secondary | ICD-10-CM | POA: Diagnosis not present

## 2017-08-23 DIAGNOSIS — N182 Chronic kidney disease, stage 2 (mild): Secondary | ICD-10-CM | POA: Diagnosis not present

## 2017-08-23 DIAGNOSIS — E1122 Type 2 diabetes mellitus with diabetic chronic kidney disease: Secondary | ICD-10-CM | POA: Diagnosis not present

## 2017-08-23 DIAGNOSIS — R69 Illness, unspecified: Secondary | ICD-10-CM | POA: Diagnosis not present

## 2017-08-23 DIAGNOSIS — I351 Nonrheumatic aortic (valve) insufficiency: Secondary | ICD-10-CM | POA: Diagnosis not present

## 2017-08-28 DIAGNOSIS — R2681 Unsteadiness on feet: Secondary | ICD-10-CM | POA: Diagnosis not present

## 2017-08-28 DIAGNOSIS — D509 Iron deficiency anemia, unspecified: Secondary | ICD-10-CM | POA: Diagnosis not present

## 2017-08-28 DIAGNOSIS — K210 Gastro-esophageal reflux disease with esophagitis: Secondary | ICD-10-CM | POA: Diagnosis not present

## 2017-08-28 DIAGNOSIS — I5032 Chronic diastolic (congestive) heart failure: Secondary | ICD-10-CM | POA: Diagnosis not present

## 2017-08-28 DIAGNOSIS — E1122 Type 2 diabetes mellitus with diabetic chronic kidney disease: Secondary | ICD-10-CM | POA: Diagnosis not present

## 2017-08-28 DIAGNOSIS — N182 Chronic kidney disease, stage 2 (mild): Secondary | ICD-10-CM | POA: Diagnosis not present

## 2017-08-28 DIAGNOSIS — I130 Hypertensive heart and chronic kidney disease with heart failure and stage 1 through stage 4 chronic kidney disease, or unspecified chronic kidney disease: Secondary | ICD-10-CM | POA: Diagnosis not present

## 2017-08-28 DIAGNOSIS — R69 Illness, unspecified: Secondary | ICD-10-CM | POA: Diagnosis not present

## 2017-08-28 DIAGNOSIS — M329 Systemic lupus erythematosus, unspecified: Secondary | ICD-10-CM | POA: Diagnosis not present

## 2017-08-28 DIAGNOSIS — I351 Nonrheumatic aortic (valve) insufficiency: Secondary | ICD-10-CM | POA: Diagnosis not present

## 2017-08-30 DIAGNOSIS — I5032 Chronic diastolic (congestive) heart failure: Secondary | ICD-10-CM | POA: Diagnosis not present

## 2017-08-30 DIAGNOSIS — I351 Nonrheumatic aortic (valve) insufficiency: Secondary | ICD-10-CM | POA: Diagnosis not present

## 2017-08-30 DIAGNOSIS — E1122 Type 2 diabetes mellitus with diabetic chronic kidney disease: Secondary | ICD-10-CM | POA: Diagnosis not present

## 2017-08-30 DIAGNOSIS — R69 Illness, unspecified: Secondary | ICD-10-CM | POA: Diagnosis not present

## 2017-08-30 DIAGNOSIS — R2681 Unsteadiness on feet: Secondary | ICD-10-CM | POA: Diagnosis not present

## 2017-08-30 DIAGNOSIS — N182 Chronic kidney disease, stage 2 (mild): Secondary | ICD-10-CM | POA: Diagnosis not present

## 2017-08-30 DIAGNOSIS — M329 Systemic lupus erythematosus, unspecified: Secondary | ICD-10-CM | POA: Diagnosis not present

## 2017-08-30 DIAGNOSIS — I130 Hypertensive heart and chronic kidney disease with heart failure and stage 1 through stage 4 chronic kidney disease, or unspecified chronic kidney disease: Secondary | ICD-10-CM | POA: Diagnosis not present

## 2017-08-30 DIAGNOSIS — D509 Iron deficiency anemia, unspecified: Secondary | ICD-10-CM | POA: Diagnosis not present

## 2017-08-30 DIAGNOSIS — K210 Gastro-esophageal reflux disease with esophagitis: Secondary | ICD-10-CM | POA: Diagnosis not present

## 2017-09-04 DIAGNOSIS — N182 Chronic kidney disease, stage 2 (mild): Secondary | ICD-10-CM | POA: Diagnosis not present

## 2017-09-04 DIAGNOSIS — R2681 Unsteadiness on feet: Secondary | ICD-10-CM | POA: Diagnosis not present

## 2017-09-04 DIAGNOSIS — I5032 Chronic diastolic (congestive) heart failure: Secondary | ICD-10-CM | POA: Diagnosis not present

## 2017-09-04 DIAGNOSIS — I130 Hypertensive heart and chronic kidney disease with heart failure and stage 1 through stage 4 chronic kidney disease, or unspecified chronic kidney disease: Secondary | ICD-10-CM | POA: Diagnosis not present

## 2017-09-04 DIAGNOSIS — E1122 Type 2 diabetes mellitus with diabetic chronic kidney disease: Secondary | ICD-10-CM | POA: Diagnosis not present

## 2017-09-04 DIAGNOSIS — I351 Nonrheumatic aortic (valve) insufficiency: Secondary | ICD-10-CM | POA: Diagnosis not present

## 2017-09-04 DIAGNOSIS — K210 Gastro-esophageal reflux disease with esophagitis: Secondary | ICD-10-CM | POA: Diagnosis not present

## 2017-09-04 DIAGNOSIS — M329 Systemic lupus erythematosus, unspecified: Secondary | ICD-10-CM | POA: Diagnosis not present

## 2017-09-04 DIAGNOSIS — R69 Illness, unspecified: Secondary | ICD-10-CM | POA: Diagnosis not present

## 2017-09-04 DIAGNOSIS — D509 Iron deficiency anemia, unspecified: Secondary | ICD-10-CM | POA: Diagnosis not present

## 2017-09-06 DIAGNOSIS — I130 Hypertensive heart and chronic kidney disease with heart failure and stage 1 through stage 4 chronic kidney disease, or unspecified chronic kidney disease: Secondary | ICD-10-CM | POA: Diagnosis not present

## 2017-09-06 DIAGNOSIS — K210 Gastro-esophageal reflux disease with esophagitis: Secondary | ICD-10-CM | POA: Diagnosis not present

## 2017-09-06 DIAGNOSIS — I5032 Chronic diastolic (congestive) heart failure: Secondary | ICD-10-CM | POA: Diagnosis not present

## 2017-09-06 DIAGNOSIS — R2681 Unsteadiness on feet: Secondary | ICD-10-CM | POA: Diagnosis not present

## 2017-09-06 DIAGNOSIS — I351 Nonrheumatic aortic (valve) insufficiency: Secondary | ICD-10-CM | POA: Diagnosis not present

## 2017-09-06 DIAGNOSIS — R69 Illness, unspecified: Secondary | ICD-10-CM | POA: Diagnosis not present

## 2017-09-06 DIAGNOSIS — M329 Systemic lupus erythematosus, unspecified: Secondary | ICD-10-CM | POA: Diagnosis not present

## 2017-09-06 DIAGNOSIS — D509 Iron deficiency anemia, unspecified: Secondary | ICD-10-CM | POA: Diagnosis not present

## 2017-09-06 DIAGNOSIS — E1122 Type 2 diabetes mellitus with diabetic chronic kidney disease: Secondary | ICD-10-CM | POA: Diagnosis not present

## 2017-09-06 DIAGNOSIS — N182 Chronic kidney disease, stage 2 (mild): Secondary | ICD-10-CM | POA: Diagnosis not present

## 2017-09-11 DIAGNOSIS — N182 Chronic kidney disease, stage 2 (mild): Secondary | ICD-10-CM | POA: Diagnosis not present

## 2017-09-11 DIAGNOSIS — I5032 Chronic diastolic (congestive) heart failure: Secondary | ICD-10-CM | POA: Diagnosis not present

## 2017-09-11 DIAGNOSIS — I351 Nonrheumatic aortic (valve) insufficiency: Secondary | ICD-10-CM | POA: Diagnosis not present

## 2017-09-11 DIAGNOSIS — I130 Hypertensive heart and chronic kidney disease with heart failure and stage 1 through stage 4 chronic kidney disease, or unspecified chronic kidney disease: Secondary | ICD-10-CM | POA: Diagnosis not present

## 2017-09-11 DIAGNOSIS — K210 Gastro-esophageal reflux disease with esophagitis: Secondary | ICD-10-CM | POA: Diagnosis not present

## 2017-09-11 DIAGNOSIS — R2681 Unsteadiness on feet: Secondary | ICD-10-CM | POA: Diagnosis not present

## 2017-09-11 DIAGNOSIS — D509 Iron deficiency anemia, unspecified: Secondary | ICD-10-CM | POA: Diagnosis not present

## 2017-09-11 DIAGNOSIS — E1122 Type 2 diabetes mellitus with diabetic chronic kidney disease: Secondary | ICD-10-CM | POA: Diagnosis not present

## 2017-09-11 DIAGNOSIS — M329 Systemic lupus erythematosus, unspecified: Secondary | ICD-10-CM | POA: Diagnosis not present

## 2017-09-11 DIAGNOSIS — R69 Illness, unspecified: Secondary | ICD-10-CM | POA: Diagnosis not present

## 2017-09-13 DIAGNOSIS — I351 Nonrheumatic aortic (valve) insufficiency: Secondary | ICD-10-CM | POA: Diagnosis not present

## 2017-09-13 DIAGNOSIS — R69 Illness, unspecified: Secondary | ICD-10-CM | POA: Diagnosis not present

## 2017-09-13 DIAGNOSIS — N182 Chronic kidney disease, stage 2 (mild): Secondary | ICD-10-CM | POA: Diagnosis not present

## 2017-09-13 DIAGNOSIS — I5032 Chronic diastolic (congestive) heart failure: Secondary | ICD-10-CM | POA: Diagnosis not present

## 2017-09-13 DIAGNOSIS — E1122 Type 2 diabetes mellitus with diabetic chronic kidney disease: Secondary | ICD-10-CM | POA: Diagnosis not present

## 2017-09-13 DIAGNOSIS — D509 Iron deficiency anemia, unspecified: Secondary | ICD-10-CM | POA: Diagnosis not present

## 2017-09-13 DIAGNOSIS — R2681 Unsteadiness on feet: Secondary | ICD-10-CM | POA: Diagnosis not present

## 2017-09-13 DIAGNOSIS — I130 Hypertensive heart and chronic kidney disease with heart failure and stage 1 through stage 4 chronic kidney disease, or unspecified chronic kidney disease: Secondary | ICD-10-CM | POA: Diagnosis not present

## 2017-09-13 DIAGNOSIS — M329 Systemic lupus erythematosus, unspecified: Secondary | ICD-10-CM | POA: Diagnosis not present

## 2017-09-13 DIAGNOSIS — K210 Gastro-esophageal reflux disease with esophagitis: Secondary | ICD-10-CM | POA: Diagnosis not present

## 2017-09-18 DIAGNOSIS — I351 Nonrheumatic aortic (valve) insufficiency: Secondary | ICD-10-CM | POA: Diagnosis not present

## 2017-09-18 DIAGNOSIS — I5032 Chronic diastolic (congestive) heart failure: Secondary | ICD-10-CM | POA: Diagnosis not present

## 2017-09-18 DIAGNOSIS — K210 Gastro-esophageal reflux disease with esophagitis: Secondary | ICD-10-CM | POA: Diagnosis not present

## 2017-09-18 DIAGNOSIS — D509 Iron deficiency anemia, unspecified: Secondary | ICD-10-CM | POA: Diagnosis not present

## 2017-09-18 DIAGNOSIS — R2681 Unsteadiness on feet: Secondary | ICD-10-CM | POA: Diagnosis not present

## 2017-09-18 DIAGNOSIS — N182 Chronic kidney disease, stage 2 (mild): Secondary | ICD-10-CM | POA: Diagnosis not present

## 2017-09-18 DIAGNOSIS — M329 Systemic lupus erythematosus, unspecified: Secondary | ICD-10-CM | POA: Diagnosis not present

## 2017-09-18 DIAGNOSIS — E1122 Type 2 diabetes mellitus with diabetic chronic kidney disease: Secondary | ICD-10-CM | POA: Diagnosis not present

## 2017-09-18 DIAGNOSIS — R69 Illness, unspecified: Secondary | ICD-10-CM | POA: Diagnosis not present

## 2017-09-18 DIAGNOSIS — I130 Hypertensive heart and chronic kidney disease with heart failure and stage 1 through stage 4 chronic kidney disease, or unspecified chronic kidney disease: Secondary | ICD-10-CM | POA: Diagnosis not present

## 2017-09-20 DIAGNOSIS — R2681 Unsteadiness on feet: Secondary | ICD-10-CM | POA: Diagnosis not present

## 2017-09-20 DIAGNOSIS — I5032 Chronic diastolic (congestive) heart failure: Secondary | ICD-10-CM | POA: Diagnosis not present

## 2017-09-20 DIAGNOSIS — D509 Iron deficiency anemia, unspecified: Secondary | ICD-10-CM | POA: Diagnosis not present

## 2017-09-20 DIAGNOSIS — N182 Chronic kidney disease, stage 2 (mild): Secondary | ICD-10-CM | POA: Diagnosis not present

## 2017-09-20 DIAGNOSIS — M329 Systemic lupus erythematosus, unspecified: Secondary | ICD-10-CM | POA: Diagnosis not present

## 2017-09-20 DIAGNOSIS — I351 Nonrheumatic aortic (valve) insufficiency: Secondary | ICD-10-CM | POA: Diagnosis not present

## 2017-09-20 DIAGNOSIS — I130 Hypertensive heart and chronic kidney disease with heart failure and stage 1 through stage 4 chronic kidney disease, or unspecified chronic kidney disease: Secondary | ICD-10-CM | POA: Diagnosis not present

## 2017-09-20 DIAGNOSIS — E1122 Type 2 diabetes mellitus with diabetic chronic kidney disease: Secondary | ICD-10-CM | POA: Diagnosis not present

## 2017-09-20 DIAGNOSIS — K210 Gastro-esophageal reflux disease with esophagitis: Secondary | ICD-10-CM | POA: Diagnosis not present

## 2017-09-20 DIAGNOSIS — R69 Illness, unspecified: Secondary | ICD-10-CM | POA: Diagnosis not present

## 2017-09-25 DIAGNOSIS — K210 Gastro-esophageal reflux disease with esophagitis: Secondary | ICD-10-CM | POA: Diagnosis not present

## 2017-09-25 DIAGNOSIS — I5032 Chronic diastolic (congestive) heart failure: Secondary | ICD-10-CM | POA: Diagnosis not present

## 2017-09-25 DIAGNOSIS — I351 Nonrheumatic aortic (valve) insufficiency: Secondary | ICD-10-CM | POA: Diagnosis not present

## 2017-09-25 DIAGNOSIS — E1122 Type 2 diabetes mellitus with diabetic chronic kidney disease: Secondary | ICD-10-CM | POA: Diagnosis not present

## 2017-09-25 DIAGNOSIS — R2681 Unsteadiness on feet: Secondary | ICD-10-CM | POA: Diagnosis not present

## 2017-09-25 DIAGNOSIS — I130 Hypertensive heart and chronic kidney disease with heart failure and stage 1 through stage 4 chronic kidney disease, or unspecified chronic kidney disease: Secondary | ICD-10-CM | POA: Diagnosis not present

## 2017-09-25 DIAGNOSIS — R69 Illness, unspecified: Secondary | ICD-10-CM | POA: Diagnosis not present

## 2017-09-25 DIAGNOSIS — D509 Iron deficiency anemia, unspecified: Secondary | ICD-10-CM | POA: Diagnosis not present

## 2017-09-25 DIAGNOSIS — N182 Chronic kidney disease, stage 2 (mild): Secondary | ICD-10-CM | POA: Diagnosis not present

## 2017-09-25 DIAGNOSIS — M329 Systemic lupus erythematosus, unspecified: Secondary | ICD-10-CM | POA: Diagnosis not present

## 2017-09-27 DIAGNOSIS — I351 Nonrheumatic aortic (valve) insufficiency: Secondary | ICD-10-CM | POA: Diagnosis not present

## 2017-09-27 DIAGNOSIS — R69 Illness, unspecified: Secondary | ICD-10-CM | POA: Diagnosis not present

## 2017-09-27 DIAGNOSIS — E1122 Type 2 diabetes mellitus with diabetic chronic kidney disease: Secondary | ICD-10-CM | POA: Diagnosis not present

## 2017-09-27 DIAGNOSIS — M329 Systemic lupus erythematosus, unspecified: Secondary | ICD-10-CM | POA: Diagnosis not present

## 2017-09-27 DIAGNOSIS — D509 Iron deficiency anemia, unspecified: Secondary | ICD-10-CM | POA: Diagnosis not present

## 2017-09-27 DIAGNOSIS — N182 Chronic kidney disease, stage 2 (mild): Secondary | ICD-10-CM | POA: Diagnosis not present

## 2017-09-27 DIAGNOSIS — R2681 Unsteadiness on feet: Secondary | ICD-10-CM | POA: Diagnosis not present

## 2017-09-27 DIAGNOSIS — K210 Gastro-esophageal reflux disease with esophagitis: Secondary | ICD-10-CM | POA: Diagnosis not present

## 2017-09-27 DIAGNOSIS — I5032 Chronic diastolic (congestive) heart failure: Secondary | ICD-10-CM | POA: Diagnosis not present

## 2017-09-27 DIAGNOSIS — I130 Hypertensive heart and chronic kidney disease with heart failure and stage 1 through stage 4 chronic kidney disease, or unspecified chronic kidney disease: Secondary | ICD-10-CM | POA: Diagnosis not present

## 2017-09-29 DIAGNOSIS — I10 Essential (primary) hypertension: Secondary | ICD-10-CM | POA: Diagnosis not present

## 2017-09-29 DIAGNOSIS — I5032 Chronic diastolic (congestive) heart failure: Secondary | ICD-10-CM | POA: Diagnosis not present

## 2017-09-29 DIAGNOSIS — N182 Chronic kidney disease, stage 2 (mild): Secondary | ICD-10-CM | POA: Diagnosis not present

## 2017-09-29 DIAGNOSIS — Z681 Body mass index (BMI) 19 or less, adult: Secondary | ICD-10-CM | POA: Diagnosis not present

## 2017-09-29 DIAGNOSIS — K210 Gastro-esophageal reflux disease with esophagitis: Secondary | ICD-10-CM | POA: Diagnosis not present

## 2017-09-29 DIAGNOSIS — M545 Low back pain: Secondary | ICD-10-CM | POA: Diagnosis not present

## 2017-09-29 DIAGNOSIS — R2681 Unsteadiness on feet: Secondary | ICD-10-CM | POA: Diagnosis not present

## 2017-09-29 DIAGNOSIS — N3946 Mixed incontinence: Secondary | ICD-10-CM | POA: Diagnosis not present

## 2017-09-30 DIAGNOSIS — K210 Gastro-esophageal reflux disease with esophagitis: Secondary | ICD-10-CM | POA: Diagnosis not present

## 2017-09-30 DIAGNOSIS — I5032 Chronic diastolic (congestive) heart failure: Secondary | ICD-10-CM | POA: Diagnosis not present

## 2017-09-30 DIAGNOSIS — E1122 Type 2 diabetes mellitus with diabetic chronic kidney disease: Secondary | ICD-10-CM | POA: Diagnosis not present

## 2017-09-30 DIAGNOSIS — I351 Nonrheumatic aortic (valve) insufficiency: Secondary | ICD-10-CM | POA: Diagnosis not present

## 2017-09-30 DIAGNOSIS — R69 Illness, unspecified: Secondary | ICD-10-CM | POA: Diagnosis not present

## 2017-09-30 DIAGNOSIS — D509 Iron deficiency anemia, unspecified: Secondary | ICD-10-CM | POA: Diagnosis not present

## 2017-09-30 DIAGNOSIS — R2689 Other abnormalities of gait and mobility: Secondary | ICD-10-CM | POA: Diagnosis not present

## 2017-09-30 DIAGNOSIS — M329 Systemic lupus erythematosus, unspecified: Secondary | ICD-10-CM | POA: Diagnosis not present

## 2017-09-30 DIAGNOSIS — N182 Chronic kidney disease, stage 2 (mild): Secondary | ICD-10-CM | POA: Diagnosis not present

## 2017-09-30 DIAGNOSIS — I130 Hypertensive heart and chronic kidney disease with heart failure and stage 1 through stage 4 chronic kidney disease, or unspecified chronic kidney disease: Secondary | ICD-10-CM | POA: Diagnosis not present

## 2017-10-04 DIAGNOSIS — I130 Hypertensive heart and chronic kidney disease with heart failure and stage 1 through stage 4 chronic kidney disease, or unspecified chronic kidney disease: Secondary | ICD-10-CM | POA: Diagnosis not present

## 2017-10-04 DIAGNOSIS — R2689 Other abnormalities of gait and mobility: Secondary | ICD-10-CM | POA: Diagnosis not present

## 2017-10-04 DIAGNOSIS — R69 Illness, unspecified: Secondary | ICD-10-CM | POA: Diagnosis not present

## 2017-10-04 DIAGNOSIS — I351 Nonrheumatic aortic (valve) insufficiency: Secondary | ICD-10-CM | POA: Diagnosis not present

## 2017-10-04 DIAGNOSIS — K210 Gastro-esophageal reflux disease with esophagitis: Secondary | ICD-10-CM | POA: Diagnosis not present

## 2017-10-04 DIAGNOSIS — I5032 Chronic diastolic (congestive) heart failure: Secondary | ICD-10-CM | POA: Diagnosis not present

## 2017-10-04 DIAGNOSIS — M329 Systemic lupus erythematosus, unspecified: Secondary | ICD-10-CM | POA: Diagnosis not present

## 2017-10-04 DIAGNOSIS — D509 Iron deficiency anemia, unspecified: Secondary | ICD-10-CM | POA: Diagnosis not present

## 2017-10-04 DIAGNOSIS — N182 Chronic kidney disease, stage 2 (mild): Secondary | ICD-10-CM | POA: Diagnosis not present

## 2017-10-04 DIAGNOSIS — E1122 Type 2 diabetes mellitus with diabetic chronic kidney disease: Secondary | ICD-10-CM | POA: Diagnosis not present

## 2017-10-06 DIAGNOSIS — E1122 Type 2 diabetes mellitus with diabetic chronic kidney disease: Secondary | ICD-10-CM | POA: Diagnosis not present

## 2017-10-06 DIAGNOSIS — M329 Systemic lupus erythematosus, unspecified: Secondary | ICD-10-CM | POA: Diagnosis not present

## 2017-10-06 DIAGNOSIS — I5032 Chronic diastolic (congestive) heart failure: Secondary | ICD-10-CM | POA: Diagnosis not present

## 2017-10-06 DIAGNOSIS — R69 Illness, unspecified: Secondary | ICD-10-CM | POA: Diagnosis not present

## 2017-10-06 DIAGNOSIS — D509 Iron deficiency anemia, unspecified: Secondary | ICD-10-CM | POA: Diagnosis not present

## 2017-10-06 DIAGNOSIS — I130 Hypertensive heart and chronic kidney disease with heart failure and stage 1 through stage 4 chronic kidney disease, or unspecified chronic kidney disease: Secondary | ICD-10-CM | POA: Diagnosis not present

## 2017-10-06 DIAGNOSIS — N182 Chronic kidney disease, stage 2 (mild): Secondary | ICD-10-CM | POA: Diagnosis not present

## 2017-10-06 DIAGNOSIS — I351 Nonrheumatic aortic (valve) insufficiency: Secondary | ICD-10-CM | POA: Diagnosis not present

## 2017-10-06 DIAGNOSIS — K210 Gastro-esophageal reflux disease with esophagitis: Secondary | ICD-10-CM | POA: Diagnosis not present

## 2017-10-06 DIAGNOSIS — R2689 Other abnormalities of gait and mobility: Secondary | ICD-10-CM | POA: Diagnosis not present

## 2017-10-11 DIAGNOSIS — I5032 Chronic diastolic (congestive) heart failure: Secondary | ICD-10-CM | POA: Diagnosis not present

## 2017-10-11 DIAGNOSIS — I130 Hypertensive heart and chronic kidney disease with heart failure and stage 1 through stage 4 chronic kidney disease, or unspecified chronic kidney disease: Secondary | ICD-10-CM | POA: Diagnosis not present

## 2017-10-11 DIAGNOSIS — K210 Gastro-esophageal reflux disease with esophagitis: Secondary | ICD-10-CM | POA: Diagnosis not present

## 2017-10-11 DIAGNOSIS — N182 Chronic kidney disease, stage 2 (mild): Secondary | ICD-10-CM | POA: Diagnosis not present

## 2017-10-11 DIAGNOSIS — R2689 Other abnormalities of gait and mobility: Secondary | ICD-10-CM | POA: Diagnosis not present

## 2017-10-11 DIAGNOSIS — R69 Illness, unspecified: Secondary | ICD-10-CM | POA: Diagnosis not present

## 2017-10-11 DIAGNOSIS — D509 Iron deficiency anemia, unspecified: Secondary | ICD-10-CM | POA: Diagnosis not present

## 2017-10-11 DIAGNOSIS — M329 Systemic lupus erythematosus, unspecified: Secondary | ICD-10-CM | POA: Diagnosis not present

## 2017-10-11 DIAGNOSIS — E1122 Type 2 diabetes mellitus with diabetic chronic kidney disease: Secondary | ICD-10-CM | POA: Diagnosis not present

## 2017-10-11 DIAGNOSIS — I351 Nonrheumatic aortic (valve) insufficiency: Secondary | ICD-10-CM | POA: Diagnosis not present

## 2017-10-13 DIAGNOSIS — R69 Illness, unspecified: Secondary | ICD-10-CM | POA: Diagnosis not present

## 2017-10-13 DIAGNOSIS — E1122 Type 2 diabetes mellitus with diabetic chronic kidney disease: Secondary | ICD-10-CM | POA: Diagnosis not present

## 2017-10-13 DIAGNOSIS — I130 Hypertensive heart and chronic kidney disease with heart failure and stage 1 through stage 4 chronic kidney disease, or unspecified chronic kidney disease: Secondary | ICD-10-CM | POA: Diagnosis not present

## 2017-10-13 DIAGNOSIS — M329 Systemic lupus erythematosus, unspecified: Secondary | ICD-10-CM | POA: Diagnosis not present

## 2017-10-13 DIAGNOSIS — I5032 Chronic diastolic (congestive) heart failure: Secondary | ICD-10-CM | POA: Diagnosis not present

## 2017-10-13 DIAGNOSIS — I351 Nonrheumatic aortic (valve) insufficiency: Secondary | ICD-10-CM | POA: Diagnosis not present

## 2017-10-13 DIAGNOSIS — N182 Chronic kidney disease, stage 2 (mild): Secondary | ICD-10-CM | POA: Diagnosis not present

## 2017-10-13 DIAGNOSIS — R2689 Other abnormalities of gait and mobility: Secondary | ICD-10-CM | POA: Diagnosis not present

## 2017-10-13 DIAGNOSIS — D509 Iron deficiency anemia, unspecified: Secondary | ICD-10-CM | POA: Diagnosis not present

## 2017-10-13 DIAGNOSIS — K210 Gastro-esophageal reflux disease with esophagitis: Secondary | ICD-10-CM | POA: Diagnosis not present

## 2017-10-16 DIAGNOSIS — I351 Nonrheumatic aortic (valve) insufficiency: Secondary | ICD-10-CM | POA: Diagnosis not present

## 2017-10-16 DIAGNOSIS — M329 Systemic lupus erythematosus, unspecified: Secondary | ICD-10-CM | POA: Diagnosis not present

## 2017-10-16 DIAGNOSIS — K210 Gastro-esophageal reflux disease with esophagitis: Secondary | ICD-10-CM | POA: Diagnosis not present

## 2017-10-16 DIAGNOSIS — R2689 Other abnormalities of gait and mobility: Secondary | ICD-10-CM | POA: Diagnosis not present

## 2017-10-16 DIAGNOSIS — R69 Illness, unspecified: Secondary | ICD-10-CM | POA: Diagnosis not present

## 2017-10-16 DIAGNOSIS — E1122 Type 2 diabetes mellitus with diabetic chronic kidney disease: Secondary | ICD-10-CM | POA: Diagnosis not present

## 2017-10-16 DIAGNOSIS — N182 Chronic kidney disease, stage 2 (mild): Secondary | ICD-10-CM | POA: Diagnosis not present

## 2017-10-16 DIAGNOSIS — I5032 Chronic diastolic (congestive) heart failure: Secondary | ICD-10-CM | POA: Diagnosis not present

## 2017-10-16 DIAGNOSIS — I130 Hypertensive heart and chronic kidney disease with heart failure and stage 1 through stage 4 chronic kidney disease, or unspecified chronic kidney disease: Secondary | ICD-10-CM | POA: Diagnosis not present

## 2017-10-16 DIAGNOSIS — D509 Iron deficiency anemia, unspecified: Secondary | ICD-10-CM | POA: Diagnosis not present

## 2017-10-21 DIAGNOSIS — I351 Nonrheumatic aortic (valve) insufficiency: Secondary | ICD-10-CM | POA: Diagnosis not present

## 2017-10-21 DIAGNOSIS — K210 Gastro-esophageal reflux disease with esophagitis: Secondary | ICD-10-CM | POA: Diagnosis not present

## 2017-10-21 DIAGNOSIS — D509 Iron deficiency anemia, unspecified: Secondary | ICD-10-CM | POA: Diagnosis not present

## 2017-10-21 DIAGNOSIS — I130 Hypertensive heart and chronic kidney disease with heart failure and stage 1 through stage 4 chronic kidney disease, or unspecified chronic kidney disease: Secondary | ICD-10-CM | POA: Diagnosis not present

## 2017-10-21 DIAGNOSIS — R69 Illness, unspecified: Secondary | ICD-10-CM | POA: Diagnosis not present

## 2017-10-21 DIAGNOSIS — N182 Chronic kidney disease, stage 2 (mild): Secondary | ICD-10-CM | POA: Diagnosis not present

## 2017-10-21 DIAGNOSIS — I5032 Chronic diastolic (congestive) heart failure: Secondary | ICD-10-CM | POA: Diagnosis not present

## 2017-10-21 DIAGNOSIS — E1122 Type 2 diabetes mellitus with diabetic chronic kidney disease: Secondary | ICD-10-CM | POA: Diagnosis not present

## 2017-10-21 DIAGNOSIS — M329 Systemic lupus erythematosus, unspecified: Secondary | ICD-10-CM | POA: Diagnosis not present

## 2017-10-21 DIAGNOSIS — R2689 Other abnormalities of gait and mobility: Secondary | ICD-10-CM | POA: Diagnosis not present

## 2017-10-23 DIAGNOSIS — K210 Gastro-esophageal reflux disease with esophagitis: Secondary | ICD-10-CM | POA: Diagnosis not present

## 2017-10-23 DIAGNOSIS — I351 Nonrheumatic aortic (valve) insufficiency: Secondary | ICD-10-CM | POA: Diagnosis not present

## 2017-10-23 DIAGNOSIS — R2689 Other abnormalities of gait and mobility: Secondary | ICD-10-CM | POA: Diagnosis not present

## 2017-10-23 DIAGNOSIS — I130 Hypertensive heart and chronic kidney disease with heart failure and stage 1 through stage 4 chronic kidney disease, or unspecified chronic kidney disease: Secondary | ICD-10-CM | POA: Diagnosis not present

## 2017-10-23 DIAGNOSIS — D509 Iron deficiency anemia, unspecified: Secondary | ICD-10-CM | POA: Diagnosis not present

## 2017-10-23 DIAGNOSIS — M329 Systemic lupus erythematosus, unspecified: Secondary | ICD-10-CM | POA: Diagnosis not present

## 2017-10-23 DIAGNOSIS — N182 Chronic kidney disease, stage 2 (mild): Secondary | ICD-10-CM | POA: Diagnosis not present

## 2017-10-23 DIAGNOSIS — R69 Illness, unspecified: Secondary | ICD-10-CM | POA: Diagnosis not present

## 2017-10-23 DIAGNOSIS — I5032 Chronic diastolic (congestive) heart failure: Secondary | ICD-10-CM | POA: Diagnosis not present

## 2017-10-23 DIAGNOSIS — E1122 Type 2 diabetes mellitus with diabetic chronic kidney disease: Secondary | ICD-10-CM | POA: Diagnosis not present

## 2017-10-25 DIAGNOSIS — R69 Illness, unspecified: Secondary | ICD-10-CM | POA: Diagnosis not present

## 2017-10-25 DIAGNOSIS — I5032 Chronic diastolic (congestive) heart failure: Secondary | ICD-10-CM | POA: Diagnosis not present

## 2017-10-25 DIAGNOSIS — K210 Gastro-esophageal reflux disease with esophagitis: Secondary | ICD-10-CM | POA: Diagnosis not present

## 2017-10-25 DIAGNOSIS — M329 Systemic lupus erythematosus, unspecified: Secondary | ICD-10-CM | POA: Diagnosis not present

## 2017-10-25 DIAGNOSIS — I351 Nonrheumatic aortic (valve) insufficiency: Secondary | ICD-10-CM | POA: Diagnosis not present

## 2017-10-25 DIAGNOSIS — I130 Hypertensive heart and chronic kidney disease with heart failure and stage 1 through stage 4 chronic kidney disease, or unspecified chronic kidney disease: Secondary | ICD-10-CM | POA: Diagnosis not present

## 2017-10-25 DIAGNOSIS — D509 Iron deficiency anemia, unspecified: Secondary | ICD-10-CM | POA: Diagnosis not present

## 2017-10-25 DIAGNOSIS — N182 Chronic kidney disease, stage 2 (mild): Secondary | ICD-10-CM | POA: Diagnosis not present

## 2017-10-25 DIAGNOSIS — R2689 Other abnormalities of gait and mobility: Secondary | ICD-10-CM | POA: Diagnosis not present

## 2017-10-25 DIAGNOSIS — E1122 Type 2 diabetes mellitus with diabetic chronic kidney disease: Secondary | ICD-10-CM | POA: Diagnosis not present

## 2017-10-30 DIAGNOSIS — R2689 Other abnormalities of gait and mobility: Secondary | ICD-10-CM | POA: Diagnosis not present

## 2017-10-30 DIAGNOSIS — D509 Iron deficiency anemia, unspecified: Secondary | ICD-10-CM | POA: Diagnosis not present

## 2017-10-30 DIAGNOSIS — I5032 Chronic diastolic (congestive) heart failure: Secondary | ICD-10-CM | POA: Diagnosis not present

## 2017-10-30 DIAGNOSIS — E1122 Type 2 diabetes mellitus with diabetic chronic kidney disease: Secondary | ICD-10-CM | POA: Diagnosis not present

## 2017-10-30 DIAGNOSIS — I130 Hypertensive heart and chronic kidney disease with heart failure and stage 1 through stage 4 chronic kidney disease, or unspecified chronic kidney disease: Secondary | ICD-10-CM | POA: Diagnosis not present

## 2017-10-30 DIAGNOSIS — N182 Chronic kidney disease, stage 2 (mild): Secondary | ICD-10-CM | POA: Diagnosis not present

## 2017-10-30 DIAGNOSIS — R69 Illness, unspecified: Secondary | ICD-10-CM | POA: Diagnosis not present

## 2017-10-30 DIAGNOSIS — M329 Systemic lupus erythematosus, unspecified: Secondary | ICD-10-CM | POA: Diagnosis not present

## 2017-10-30 DIAGNOSIS — I351 Nonrheumatic aortic (valve) insufficiency: Secondary | ICD-10-CM | POA: Diagnosis not present

## 2017-10-30 DIAGNOSIS — K210 Gastro-esophageal reflux disease with esophagitis: Secondary | ICD-10-CM | POA: Diagnosis not present

## 2017-11-01 DIAGNOSIS — D509 Iron deficiency anemia, unspecified: Secondary | ICD-10-CM | POA: Diagnosis not present

## 2017-11-01 DIAGNOSIS — R69 Illness, unspecified: Secondary | ICD-10-CM | POA: Diagnosis not present

## 2017-11-01 DIAGNOSIS — R2689 Other abnormalities of gait and mobility: Secondary | ICD-10-CM | POA: Diagnosis not present

## 2017-11-01 DIAGNOSIS — E1122 Type 2 diabetes mellitus with diabetic chronic kidney disease: Secondary | ICD-10-CM | POA: Diagnosis not present

## 2017-11-01 DIAGNOSIS — N182 Chronic kidney disease, stage 2 (mild): Secondary | ICD-10-CM | POA: Diagnosis not present

## 2017-11-01 DIAGNOSIS — I351 Nonrheumatic aortic (valve) insufficiency: Secondary | ICD-10-CM | POA: Diagnosis not present

## 2017-11-01 DIAGNOSIS — I5032 Chronic diastolic (congestive) heart failure: Secondary | ICD-10-CM | POA: Diagnosis not present

## 2017-11-01 DIAGNOSIS — I130 Hypertensive heart and chronic kidney disease with heart failure and stage 1 through stage 4 chronic kidney disease, or unspecified chronic kidney disease: Secondary | ICD-10-CM | POA: Diagnosis not present

## 2017-11-01 DIAGNOSIS — K210 Gastro-esophageal reflux disease with esophagitis: Secondary | ICD-10-CM | POA: Diagnosis not present

## 2017-11-01 DIAGNOSIS — M329 Systemic lupus erythematosus, unspecified: Secondary | ICD-10-CM | POA: Diagnosis not present

## 2017-11-06 DIAGNOSIS — D509 Iron deficiency anemia, unspecified: Secondary | ICD-10-CM | POA: Diagnosis not present

## 2017-11-06 DIAGNOSIS — I351 Nonrheumatic aortic (valve) insufficiency: Secondary | ICD-10-CM | POA: Diagnosis not present

## 2017-11-06 DIAGNOSIS — K210 Gastro-esophageal reflux disease with esophagitis: Secondary | ICD-10-CM | POA: Diagnosis not present

## 2017-11-06 DIAGNOSIS — I130 Hypertensive heart and chronic kidney disease with heart failure and stage 1 through stage 4 chronic kidney disease, or unspecified chronic kidney disease: Secondary | ICD-10-CM | POA: Diagnosis not present

## 2017-11-06 DIAGNOSIS — M329 Systemic lupus erythematosus, unspecified: Secondary | ICD-10-CM | POA: Diagnosis not present

## 2017-11-06 DIAGNOSIS — E1122 Type 2 diabetes mellitus with diabetic chronic kidney disease: Secondary | ICD-10-CM | POA: Diagnosis not present

## 2017-11-06 DIAGNOSIS — R2689 Other abnormalities of gait and mobility: Secondary | ICD-10-CM | POA: Diagnosis not present

## 2017-11-06 DIAGNOSIS — I5032 Chronic diastolic (congestive) heart failure: Secondary | ICD-10-CM | POA: Diagnosis not present

## 2017-11-06 DIAGNOSIS — N182 Chronic kidney disease, stage 2 (mild): Secondary | ICD-10-CM | POA: Diagnosis not present

## 2017-11-06 DIAGNOSIS — R69 Illness, unspecified: Secondary | ICD-10-CM | POA: Diagnosis not present

## 2017-11-13 DIAGNOSIS — D509 Iron deficiency anemia, unspecified: Secondary | ICD-10-CM | POA: Diagnosis not present

## 2017-11-13 DIAGNOSIS — I130 Hypertensive heart and chronic kidney disease with heart failure and stage 1 through stage 4 chronic kidney disease, or unspecified chronic kidney disease: Secondary | ICD-10-CM | POA: Diagnosis not present

## 2017-11-13 DIAGNOSIS — R2689 Other abnormalities of gait and mobility: Secondary | ICD-10-CM | POA: Diagnosis not present

## 2017-11-13 DIAGNOSIS — M329 Systemic lupus erythematosus, unspecified: Secondary | ICD-10-CM | POA: Diagnosis not present

## 2017-11-13 DIAGNOSIS — E1122 Type 2 diabetes mellitus with diabetic chronic kidney disease: Secondary | ICD-10-CM | POA: Diagnosis not present

## 2017-11-13 DIAGNOSIS — I351 Nonrheumatic aortic (valve) insufficiency: Secondary | ICD-10-CM | POA: Diagnosis not present

## 2017-11-13 DIAGNOSIS — K210 Gastro-esophageal reflux disease with esophagitis: Secondary | ICD-10-CM | POA: Diagnosis not present

## 2017-11-13 DIAGNOSIS — N182 Chronic kidney disease, stage 2 (mild): Secondary | ICD-10-CM | POA: Diagnosis not present

## 2017-11-13 DIAGNOSIS — R69 Illness, unspecified: Secondary | ICD-10-CM | POA: Diagnosis not present

## 2017-11-13 DIAGNOSIS — I5032 Chronic diastolic (congestive) heart failure: Secondary | ICD-10-CM | POA: Diagnosis not present

## 2017-11-15 DIAGNOSIS — N182 Chronic kidney disease, stage 2 (mild): Secondary | ICD-10-CM | POA: Diagnosis not present

## 2017-11-15 DIAGNOSIS — D509 Iron deficiency anemia, unspecified: Secondary | ICD-10-CM | POA: Diagnosis not present

## 2017-11-15 DIAGNOSIS — E1122 Type 2 diabetes mellitus with diabetic chronic kidney disease: Secondary | ICD-10-CM | POA: Diagnosis not present

## 2017-11-15 DIAGNOSIS — M329 Systemic lupus erythematosus, unspecified: Secondary | ICD-10-CM | POA: Diagnosis not present

## 2017-11-15 DIAGNOSIS — I130 Hypertensive heart and chronic kidney disease with heart failure and stage 1 through stage 4 chronic kidney disease, or unspecified chronic kidney disease: Secondary | ICD-10-CM | POA: Diagnosis not present

## 2017-11-15 DIAGNOSIS — K210 Gastro-esophageal reflux disease with esophagitis: Secondary | ICD-10-CM | POA: Diagnosis not present

## 2017-11-15 DIAGNOSIS — R69 Illness, unspecified: Secondary | ICD-10-CM | POA: Diagnosis not present

## 2017-11-15 DIAGNOSIS — R2689 Other abnormalities of gait and mobility: Secondary | ICD-10-CM | POA: Diagnosis not present

## 2017-11-15 DIAGNOSIS — I5032 Chronic diastolic (congestive) heart failure: Secondary | ICD-10-CM | POA: Diagnosis not present

## 2017-11-15 DIAGNOSIS — I351 Nonrheumatic aortic (valve) insufficiency: Secondary | ICD-10-CM | POA: Diagnosis not present

## 2017-11-20 DIAGNOSIS — N182 Chronic kidney disease, stage 2 (mild): Secondary | ICD-10-CM | POA: Diagnosis not present

## 2017-11-20 DIAGNOSIS — K210 Gastro-esophageal reflux disease with esophagitis: Secondary | ICD-10-CM | POA: Diagnosis not present

## 2017-11-20 DIAGNOSIS — I351 Nonrheumatic aortic (valve) insufficiency: Secondary | ICD-10-CM | POA: Diagnosis not present

## 2017-11-20 DIAGNOSIS — I130 Hypertensive heart and chronic kidney disease with heart failure and stage 1 through stage 4 chronic kidney disease, or unspecified chronic kidney disease: Secondary | ICD-10-CM | POA: Diagnosis not present

## 2017-11-20 DIAGNOSIS — I5032 Chronic diastolic (congestive) heart failure: Secondary | ICD-10-CM | POA: Diagnosis not present

## 2017-11-20 DIAGNOSIS — M329 Systemic lupus erythematosus, unspecified: Secondary | ICD-10-CM | POA: Diagnosis not present

## 2017-11-20 DIAGNOSIS — D509 Iron deficiency anemia, unspecified: Secondary | ICD-10-CM | POA: Diagnosis not present

## 2017-11-20 DIAGNOSIS — E1122 Type 2 diabetes mellitus with diabetic chronic kidney disease: Secondary | ICD-10-CM | POA: Diagnosis not present

## 2017-11-20 DIAGNOSIS — R69 Illness, unspecified: Secondary | ICD-10-CM | POA: Diagnosis not present

## 2017-11-20 DIAGNOSIS — R2689 Other abnormalities of gait and mobility: Secondary | ICD-10-CM | POA: Diagnosis not present

## 2017-11-22 ENCOUNTER — Telehealth: Payer: Self-pay | Admitting: Cardiovascular Disease

## 2017-11-22 DIAGNOSIS — I5032 Chronic diastolic (congestive) heart failure: Secondary | ICD-10-CM | POA: Diagnosis not present

## 2017-11-22 DIAGNOSIS — I130 Hypertensive heart and chronic kidney disease with heart failure and stage 1 through stage 4 chronic kidney disease, or unspecified chronic kidney disease: Secondary | ICD-10-CM | POA: Diagnosis not present

## 2017-11-22 DIAGNOSIS — I351 Nonrheumatic aortic (valve) insufficiency: Secondary | ICD-10-CM | POA: Diagnosis not present

## 2017-11-22 DIAGNOSIS — M329 Systemic lupus erythematosus, unspecified: Secondary | ICD-10-CM | POA: Diagnosis not present

## 2017-11-22 DIAGNOSIS — D509 Iron deficiency anemia, unspecified: Secondary | ICD-10-CM | POA: Diagnosis not present

## 2017-11-22 DIAGNOSIS — R2689 Other abnormalities of gait and mobility: Secondary | ICD-10-CM | POA: Diagnosis not present

## 2017-11-22 DIAGNOSIS — R69 Illness, unspecified: Secondary | ICD-10-CM | POA: Diagnosis not present

## 2017-11-22 DIAGNOSIS — N182 Chronic kidney disease, stage 2 (mild): Secondary | ICD-10-CM | POA: Diagnosis not present

## 2017-11-22 DIAGNOSIS — E1122 Type 2 diabetes mellitus with diabetic chronic kidney disease: Secondary | ICD-10-CM | POA: Diagnosis not present

## 2017-11-22 DIAGNOSIS — K210 Gastro-esophageal reflux disease with esophagitis: Secondary | ICD-10-CM | POA: Diagnosis not present

## 2017-11-22 NOTE — Telephone Encounter (Signed)
Numerous attempts to contact patient with recall letters. Unable to reach by telephone. with no success.   User: Time: Status:    Karla Dunn [[id number]] 10/06/2016 3:41 PM New [10]   [System] 10/31/2016 11:04 PM Notification Sent [20]   Karla Dunn [[id number]] 09/14/2017 11:45 AM Notification Sent [20]   Karla Dunn [[id number]] 11/22/2017 12:48 PM Notification Sent [20]

## 2018-01-09 DIAGNOSIS — I5032 Chronic diastolic (congestive) heart failure: Secondary | ICD-10-CM | POA: Diagnosis not present

## 2018-01-09 DIAGNOSIS — D519 Vitamin B12 deficiency anemia, unspecified: Secondary | ICD-10-CM | POA: Diagnosis not present

## 2018-01-09 DIAGNOSIS — K210 Gastro-esophageal reflux disease with esophagitis: Secondary | ICD-10-CM | POA: Diagnosis not present

## 2018-01-09 DIAGNOSIS — R5383 Other fatigue: Secondary | ICD-10-CM | POA: Diagnosis not present

## 2018-01-09 DIAGNOSIS — D509 Iron deficiency anemia, unspecified: Secondary | ICD-10-CM | POA: Diagnosis not present

## 2018-01-09 DIAGNOSIS — N182 Chronic kidney disease, stage 2 (mild): Secondary | ICD-10-CM | POA: Diagnosis not present

## 2018-01-09 DIAGNOSIS — I10 Essential (primary) hypertension: Secondary | ICD-10-CM | POA: Diagnosis not present

## 2018-01-10 IMAGING — CT CT HEAD W/O CM
3 of 4 series · 16 of 47 positions shown, 19 images · non-contrast
Comparison: July 13, 2016.

CLINICAL DATA: Recent subdural hematoma due to fall approximately 1
month prior

EXAM:
CT HEAD WITHOUT CONTRAST
TECHNIQUE: Contiguous axial images were obtained from the base of the skull
through the vertex without intravenous contrast.

[Series 32: 3d filtered head w/o · axial · non-contrast · 0.40mm/px · z∈[-45,+85]mm · 10 of 32 slices shown, 13 images]
[im 3/32  brain]
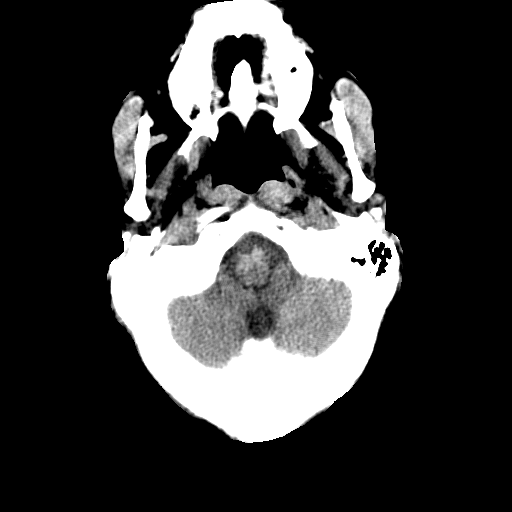
[im 3/32  bone]
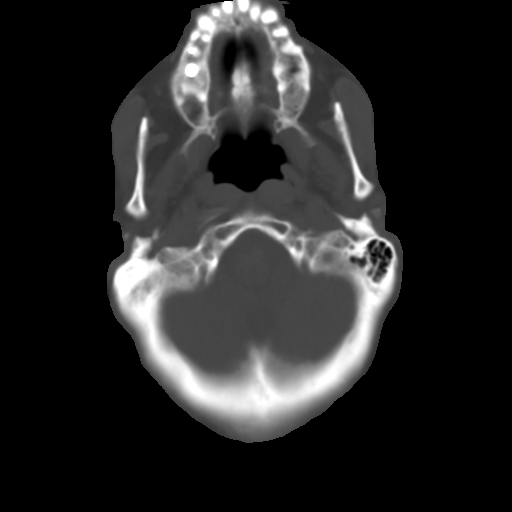
[im 5/32  brain]
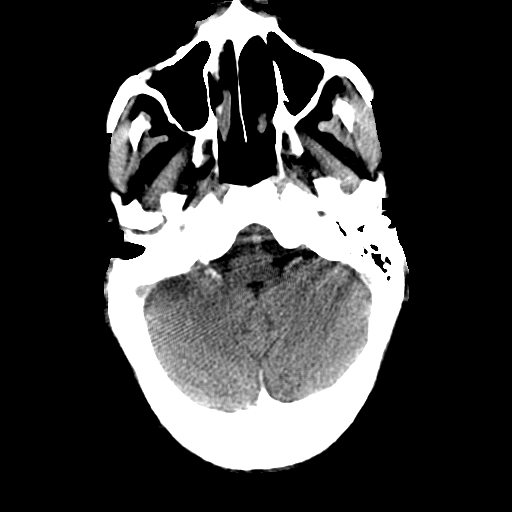
[im 9/32  brain]
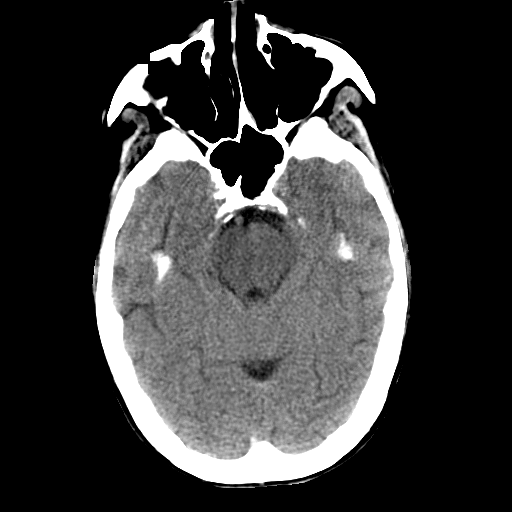
[im 12/32  brain]
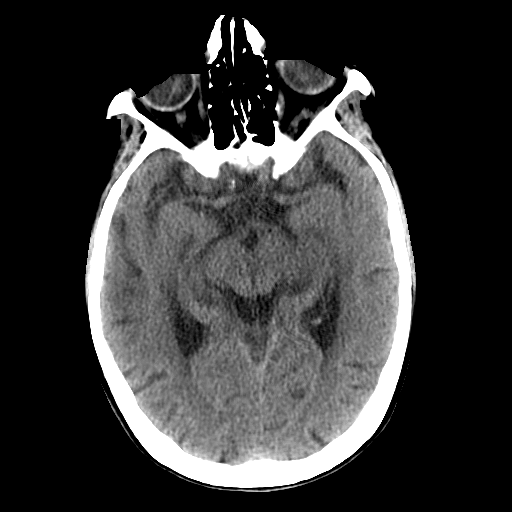
[im 14/32  brain]
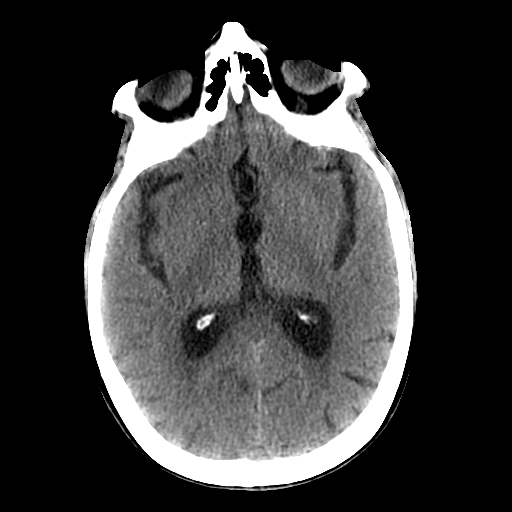
[im 14/32  bone]
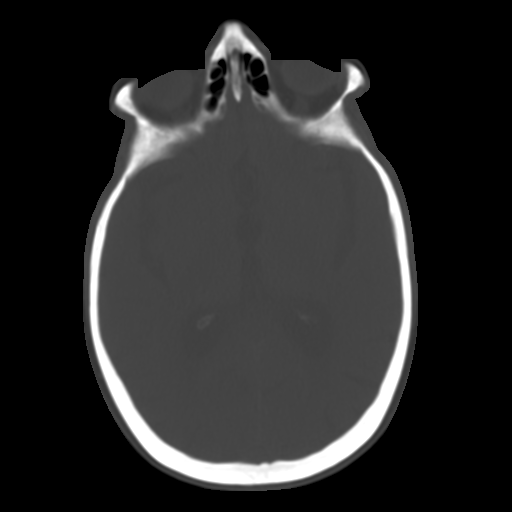
[im 18/32  brain]
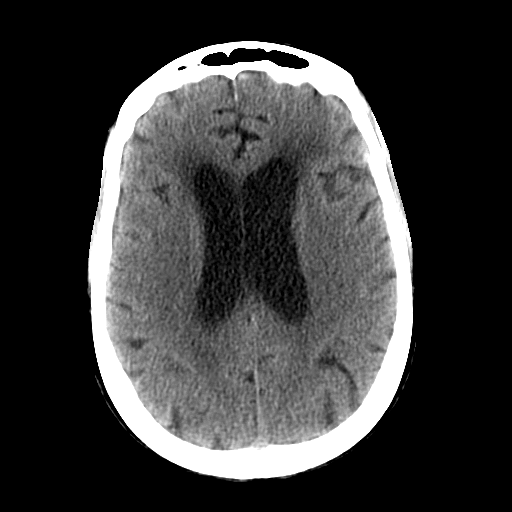
[im 20/32  brain]
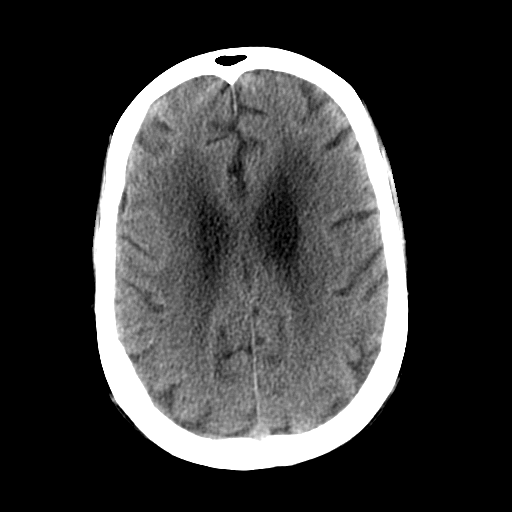
[im 23/32  brain]
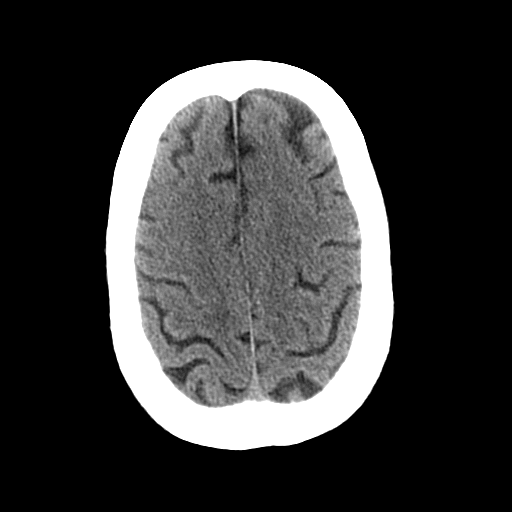
[im 27/32  brain]
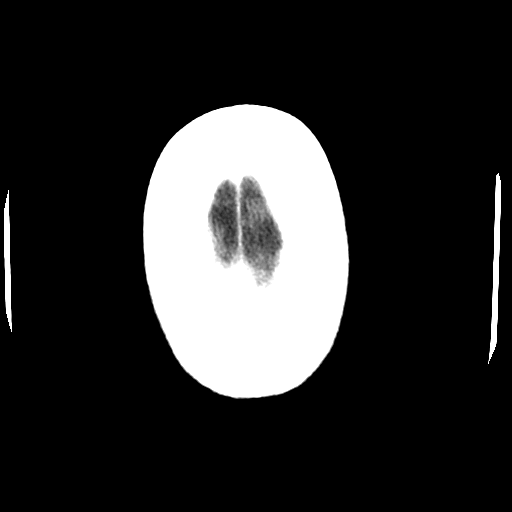
[im 27/32  bone]
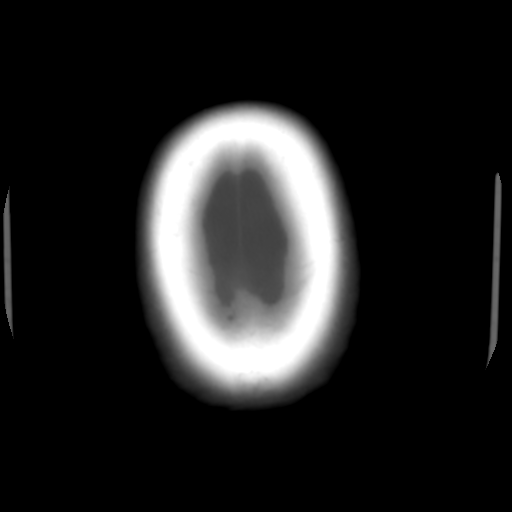
[im 29/32  brain]
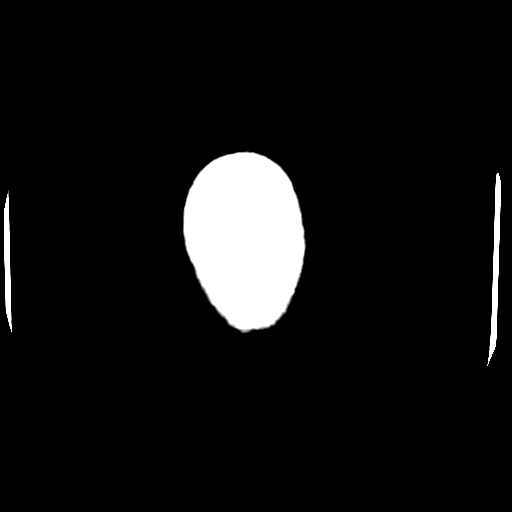

[Series 601: coronal brain · coronal · 0.40mm/px · 3 of 67 slices shown]
[im 23/67  brain]
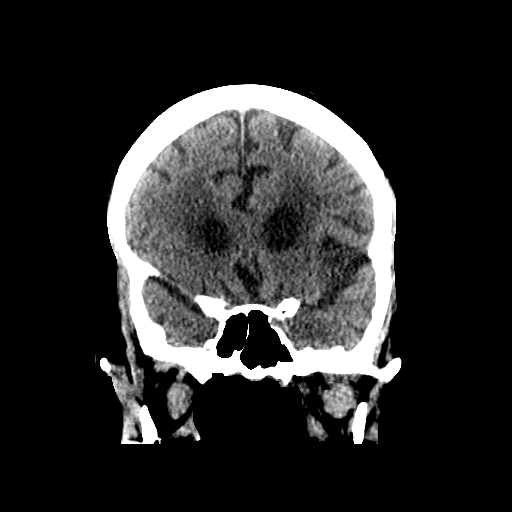
[im 30/67  brain]
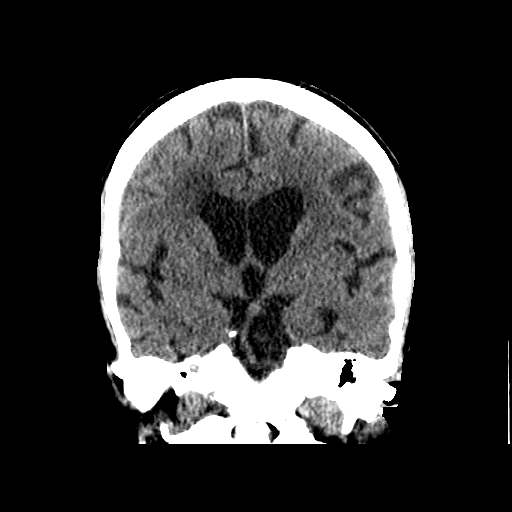
[im 37/67  brain]
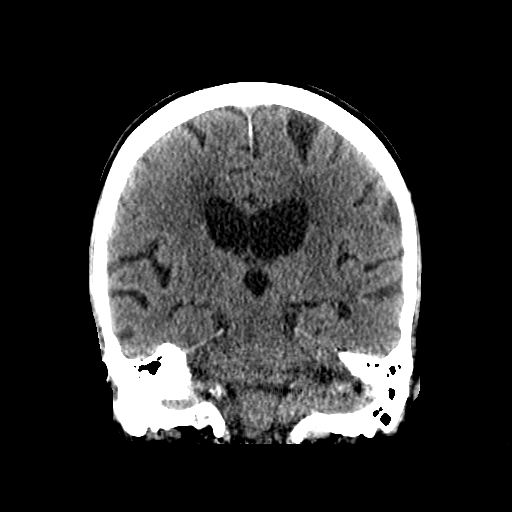

[Series 602: sagittal brain · sagittal · 0.40mm/px · 3 of 50 slices shown]
[im 17/50  brain]
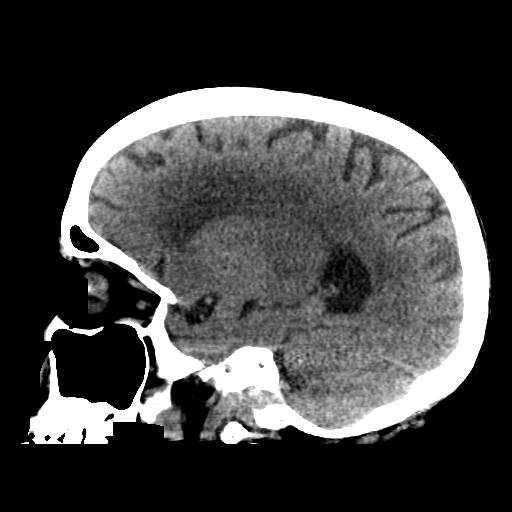
[im 25/50  brain]
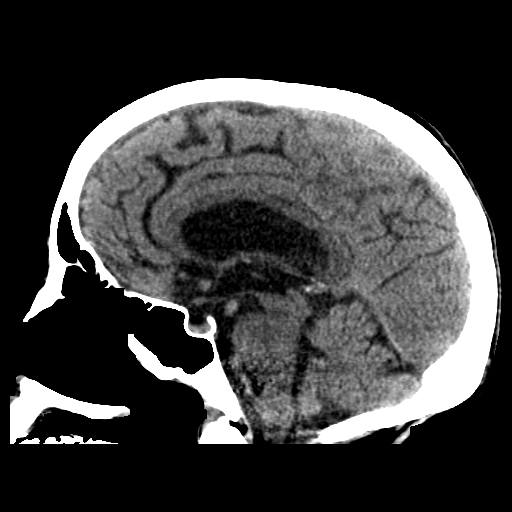
[im 33/50  brain]
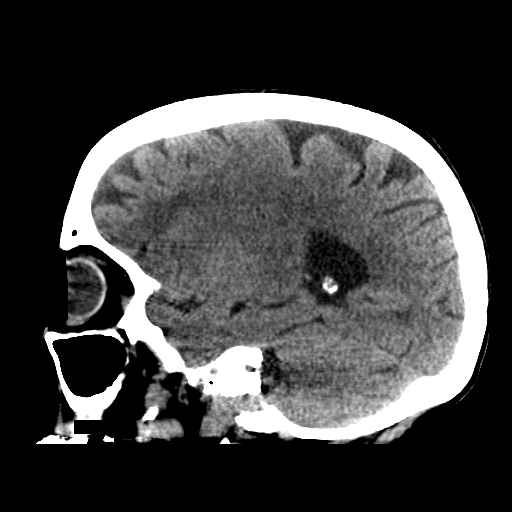

[16 of 47 positions shown; findings below may reference images not displayed]

FINDINGS: Brain: The previously noted right subdural hematoma has resolved.
Currently, no extra-axial fluid is demonstrable. There is underlying
mild diffuse atrophy. There is no intracranial mass, hemorrhage, or
midline shift. Small vessel disease throughout the centra semiovale
bilaterally is stable. No acute infarct is demonstrable.

Vascular: There is no hyperdense vessel. There is atherosclerotic
calcification in each carotid artery. There is also calcification in
the distal left vertebral artery.

Skull: The bony calvarium appears intact.

Sinuses/Orbits: There is slight mucosal thickening in several
ethmoid air cells bilaterally. Other paranasal sinuses are clear.
Orbits appear symmetric bilaterally.

Other: There is opacification in several inferior mastoid air cells
on the right, stable. Mastoids elsewhere are clear.
IMPRESSION: 1. Interval resolution of right-sided subdural hematoma. No
extra-axial fluid collection currently present.

2., No evident mass, hemorrhage, or midline shift. Atrophy with
supratentorial small vessel disease is stable. No acute infarct
evident.

3.  Foci of arterial vascular calcification noted.

. Slight mucosal thickening in several ethmoid air cells.
Opacification of several inferior mastoid air cells on the right,
stable.

## 2018-01-11 DIAGNOSIS — R300 Dysuria: Secondary | ICD-10-CM | POA: Diagnosis not present

## 2018-01-11 DIAGNOSIS — R69 Illness, unspecified: Secondary | ICD-10-CM | POA: Diagnosis not present

## 2018-01-11 DIAGNOSIS — Z681 Body mass index (BMI) 19 or less, adult: Secondary | ICD-10-CM | POA: Diagnosis not present

## 2018-01-11 DIAGNOSIS — K210 Gastro-esophageal reflux disease with esophagitis: Secondary | ICD-10-CM | POA: Diagnosis not present

## 2018-01-11 DIAGNOSIS — E441 Mild protein-calorie malnutrition: Secondary | ICD-10-CM | POA: Diagnosis not present

## 2018-01-11 DIAGNOSIS — R2681 Unsteadiness on feet: Secondary | ICD-10-CM | POA: Diagnosis not present

## 2018-01-11 DIAGNOSIS — H9193 Unspecified hearing loss, bilateral: Secondary | ICD-10-CM | POA: Diagnosis not present

## 2018-01-11 DIAGNOSIS — M3210 Systemic lupus erythematosus, organ or system involvement unspecified: Secondary | ICD-10-CM | POA: Diagnosis not present

## 2018-01-11 DIAGNOSIS — I10 Essential (primary) hypertension: Secondary | ICD-10-CM | POA: Diagnosis not present

## 2018-01-11 DIAGNOSIS — N3946 Mixed incontinence: Secondary | ICD-10-CM | POA: Diagnosis not present

## 2018-01-11 DIAGNOSIS — I5032 Chronic diastolic (congestive) heart failure: Secondary | ICD-10-CM | POA: Diagnosis not present

## 2018-01-11 DIAGNOSIS — N182 Chronic kidney disease, stage 2 (mild): Secondary | ICD-10-CM | POA: Diagnosis not present

## 2018-01-11 DIAGNOSIS — Z23 Encounter for immunization: Secondary | ICD-10-CM | POA: Diagnosis not present

## 2018-01-11 DIAGNOSIS — M545 Low back pain: Secondary | ICD-10-CM | POA: Diagnosis not present

## 2018-02-07 ENCOUNTER — Ambulatory Visit (INDEPENDENT_AMBULATORY_CARE_PROVIDER_SITE_OTHER): Payer: Medicare HMO | Admitting: Internal Medicine

## 2018-02-22 ENCOUNTER — Ambulatory Visit (INDEPENDENT_AMBULATORY_CARE_PROVIDER_SITE_OTHER): Payer: Medicare HMO | Admitting: Internal Medicine

## 2018-03-12 DIAGNOSIS — R300 Dysuria: Secondary | ICD-10-CM | POA: Diagnosis not present

## 2018-06-01 DIAGNOSIS — R300 Dysuria: Secondary | ICD-10-CM | POA: Diagnosis not present

## 2018-06-03 ENCOUNTER — Encounter (HOSPITAL_COMMUNITY): Payer: Self-pay | Admitting: Emergency Medicine

## 2018-06-03 ENCOUNTER — Emergency Department (HOSPITAL_COMMUNITY)
Admission: EM | Admit: 2018-06-03 | Discharge: 2018-06-03 | Disposition: A | Discharge status: Home / Self Care | Payer: Medicare HMO | Attending: Emergency Medicine | Admitting: Emergency Medicine

## 2018-06-03 ENCOUNTER — Other Ambulatory Visit: Payer: Self-pay

## 2018-06-03 DIAGNOSIS — Z79899 Other long term (current) drug therapy: Secondary | ICD-10-CM | POA: Diagnosis not present

## 2018-06-03 DIAGNOSIS — I5032 Chronic diastolic (congestive) heart failure: Secondary | ICD-10-CM | POA: Insufficient documentation

## 2018-06-03 DIAGNOSIS — R2232 Localized swelling, mass and lump, left upper limb: Secondary | ICD-10-CM | POA: Diagnosis present

## 2018-06-03 DIAGNOSIS — M7981 Nontraumatic hematoma of soft tissue: Secondary | ICD-10-CM | POA: Insufficient documentation

## 2018-06-03 DIAGNOSIS — Z7982 Long term (current) use of aspirin: Secondary | ICD-10-CM | POA: Diagnosis not present

## 2018-06-03 DIAGNOSIS — T148XXA Other injury of unspecified body region, initial encounter: Secondary | ICD-10-CM

## 2018-06-03 LAB — COMPREHENSIVE METABOLIC PANEL
ALT: 13 U/L (ref 0–44)
AST: 15 U/L (ref 15–41)
Albumin: 3.9 g/dL (ref 3.5–5.0)
Alkaline Phosphatase: 102 U/L (ref 38–126)
Anion gap: 11 (ref 5–15)
BUN: 21 mg/dL (ref 8–23)
CO2: 25 mmol/L (ref 22–32)
Calcium: 9.5 mg/dL (ref 8.9–10.3)
Chloride: 99 mmol/L (ref 98–111)
Creatinine, Ser: 0.74 mg/dL (ref 0.44–1.00)
GFR calc Af Amer: >60 mL/min (ref >60)
GFR calc non Af Amer: >60 mL/min (ref >60)
Glucose, Bld: 124 mg/dL — ABNORMAL HIGH (ref 70–99)
Potassium: 4.1 mmol/L (ref 3.5–5.1)
Sodium: 135 mmol/L (ref 135–145)
Total Bilirubin: 0.5 mg/dL (ref 0.3–1.2)
Total Protein: 7.1 g/dL (ref 6.5–8.1)

## 2018-06-03 LAB — CBC WITH DIFFERENTIAL/PLATELET
Abs Immature Granulocytes: 0.03 10*3/uL (ref 0.00–0.07)
Basophils Absolute: 0.1 10*3/uL (ref 0.0–0.1)
Basophils Relative: 1 %
Eosinophils Absolute: 0.1 10*3/uL (ref 0.0–0.5)
Eosinophils Relative: 1 %
HCT: 42.8 % (ref 36.0–46.0)
Hemoglobin: 14.1 g/dL (ref 12.0–15.0)
Immature Granulocytes: 0 %
Lymphocytes Relative: 23 %
Lymphs Abs: 1.6 10*3/uL (ref 0.7–4.0)
MCH: 28.7 pg (ref 26.0–34.0)
MCHC: 32.9 g/dL (ref 30.0–36.0)
MCV: 87.2 fL (ref 80.0–100.0)
Monocytes Absolute: 1.1 10*3/uL — ABNORMAL HIGH (ref 0.1–1.0)
Monocytes Relative: 16 %
Neutro Abs: 4.1 10*3/uL (ref 1.7–7.7)
Neutrophils Relative %: 59 %
Platelets: 290 10*3/uL (ref 150–400)
RBC: 4.91 MIL/uL (ref 3.87–5.11)
RDW: 14.7 % (ref 11.5–15.5)
WBC Morphology: INCREASED
WBC: 7.1 10*3/uL (ref 4.0–10.5)
nRBC: 0 % (ref 0.0–0.2)

## 2018-06-03 LAB — PROTIME-INR
INR: 1 (ref 0.8–1.2)
Prothrombin Time: 12.6 seconds (ref 11.4–15.2)

## 2018-06-03 NOTE — ED Notes (Signed)
JI in to assess 

## 2018-06-03 NOTE — ED Notes (Signed)
Arm is wrapped   No swelling noted above or below wrap   Pt without complaint

## 2018-06-03 NOTE — ED Triage Notes (Signed)
Patient with sudden onset of swelling in the L lower arm. No injury, patient not on blood thinners. Large hematoma noted.

## 2018-06-03 NOTE — ED Notes (Signed)
Pt reports she was workingon a puzzle on the table and noted a swelling to her upper L FA  Swelling noted to upper L forearm with tenderness to palpation   Ice applied

## 2018-06-03 NOTE — ED Provider Notes (Signed)
[facility] EMERGENCY DEPARTMENT Provider Note   CSN: [id number] Arrival date & time: 06/03/18  1610    History   Chief Complaint Chief Complaint  Patient presents with  . Arm Swelling    HPI Karla Dunn is a 83 y.o. female with CHF and aortic valve insufficiency, takes baby aspirin daily but no other blood thinning medications presenting with acute onset of left antecubital swelling and bruising which occurred an hour before arrival. She denies any injury of the site, states was simply sitting at a table when the left arm started feeling painful and tight.  She denies radiation of pain and has normal sensation in her forearm and hands.  She has had no treatment prior ot arrival.  She does report an occasional fall, none recent, sustained a bruise on her volar left forearm several weeks ago from a direct blow which has been sx free. She denies chest pain, shortness of breath or other complaint.      The history is provided by the patient.    Past Medical History:  Diagnosis Date  . Chronic diastolic heart failure (HCC)   . Nonrheumatic aortic valve insufficiency     Patient Active Problem List   Diagnosis Date Noted  . Chronic diastolic heart failure [facility])     Past Surgical History:  Procedure Laterality Date  . CATARACT EXTRACTION  11/17/2010  . COLONOSCOPY  05/2009  . OTHER SURGICAL HISTORY  09/2014   hysterectomy wtih rt leimyoma removal   . RHINOPLASTY  1986     OB History    Gravida  2   Para  2   Term  2   Preterm      AB      Living  1     SAB      TAB      Ectopic      Multiple      Live Births               Home Medications    Prior to Admission medications   Medication Sig Start Date End Date Taking? Authorizing Provider  albuterol (PROAIR HFA) 108 (90 Base) MCG/ACT inhaler Inhale into the lungs every 6 (six) hours as needed for wheezing or shortness of breath.    [provider]  aspirin EC 81 MG tablet Take 81 mg by  mouth daily.    [provider]  buPROPion (WELLBUTRIN) 75 MG tablet Take 75 mg by mouth 3 (three) times daily.    [provider]  Calcium Carbonate-Vit D-Min (CALTRATE 600+D PLUS MINERALS) 600-800 MG-UNIT TABS Take 1 tablet by mouth 2 (two) times daily.    [provider]  hydroxychloroquine (PLAQUENIL) 200 MG tablet Take 1 tablet by mouth daily. 05/13/16   [provider]  Multiple Vitamins-Minerals (PRESERVISION AREDS PO) Take 1 tablet by mouth daily.    [provider]  omeprazole (PRILOSEC OTC) 20 MG tablet Take 20 mg by mouth daily.    [provider]  PARoxetine (PAXIL) 20 MG tablet Take 20 mg by mouth 2 (two) times daily.     [provider]  Polyethyl Glycol-Propyl Glycol (SYSTANE) 0.4-0.3 % GEL ophthalmic gel Place 1 application into both eyes as needed (dry eye).    [provider]  polyethylene glycol (MIRALAX / GLYCOLAX) packet Take 17 g by mouth daily.    [provider]  spironolactone-hydrochlorothiazide (ALDACTAZIDE) 25-25 MG tablet Take 1 tablet by mouth daily.    [provider]  temazepam (RESTORIL) 15 MG capsule Take 30 mg by mouth at bedtime.     [provider]    Family History Family History  Problem Relation Age of Onset  . Depression Mother   . Alcohol abuse Father   . Depression Maternal Aunt   . Alcohol abuse Paternal Uncle   . COPD Maternal Grandfather   . Alzheimer's disease Paternal Grandmother     Social History Social History   Tobacco Use  . Smoking status: Never Smoker  . Smokeless tobacco: Never Used  Substance Use Topics  . Alcohol use: No  . Drug use: No     Allergies   Iron complex [chromagen]   Review of Systems Review of Systems  Constitutional: Negative for chills and fever.  HENT: Negative for congestion and sore throat.   Eyes: Negative.   Respiratory: Negative for chest tightness and shortness of breath.   Cardiovascular:  Negative for chest pain.  Gastrointestinal: Negative for abdominal pain and nausea.  Genitourinary: Negative.   Musculoskeletal: Positive for joint swelling. Negative for arthralgias and neck pain.  Skin: Positive for color change. Negative for rash and wound.  Neurological: Negative for dizziness, weakness, light-headedness, numbness and headaches.  Psychiatric/Behavioral: Negative.      Physical Exam Updated Vital Signs BP (!) 142/76 (BP Location: Right Arm)   Pulse 89   Resp 16   Ht 5\' 3"  (1.6 m)   Wt 45.4 kg   SpO2 97%   BMI 17.71 kg/m   Physical Exam Vitals signs and nursing note reviewed.  Constitutional:      Appearance: She is well-developed.  HENT:     Head: Normocephalic and atraumatic.  Eyes:     Conjunctiva/sclera: Conjunctivae normal.  Cardiovascular:     Rate and Rhythm: Normal rate and regular rhythm.     Pulses:          Radial pulses are 2+ on the right side and 2+ on the left side.     Heart sounds: Normal heart sounds.  Pulmonary:     Effort: Pulmonary effort is normal.     Breath sounds: Normal breath sounds. No wheezing.  Musculoskeletal: Normal range of motion.        General: Swelling present.  Skin:    General: Skin is warm and dry.     Findings: Bruising present.     Comments: Bruising and large hematoma approx baseball sized localized to the left antecubital space.  Distal sensation intact, radial pulse full.  Pt moves all fingers and wrist without discomfort.   Neurological:     Mental Status: She is alert.      ED Treatments / Results  Labs (all labs ordered are listed, but only abnormal results are displayed) Labs Reviewed  CBC WITH DIFFERENTIAL/PLATELET - Abnormal; Notable for the following components:      Result Value   Monocytes Absolute 1.1 (*)    All other components within normal limits  COMPREHENSIVE METABOLIC PANEL - Abnormal; Notable for the following components:   Glucose, Bld 124 (*)    All other components within  normal limits  PROTIME-INR    EKG None  Radiology No results found.   Bedside US per Dr. Jacqulyn Bath with findings more consistent with venous bleed, less likely arterial.   Procedures Procedures (including critical care time)  Medications Ordered in ED Medications - No data to display   Initial Impression / Assessment and Plan / ED Course  I have reviewed the  triage vital signs and the nursing notes.  Pertinent labs & imaging results that were available during my care of the patient were reviewed by me and considered in my medical decision making (see chart for details).        Pt with quick onset swelling left antecubital space, stable since arrival, suspected hematoma/venous source.  She was observed in the department, gentle ace wrap, ice applied. No expanding area or complaint of pain or new sx prior to dc.  She was advised to maintain ace for compression, ice packs.  Referral to Dr. Arbie CookeyEarly for vascular f/u this week.  Return precautions discussed.  Good radial pulse and normal cap refill in finger tips at time of dc. No evidence of compartment syndrome.   Final Clinical Impressions(s) / ED Diagnoses   Final diagnoses:  Hematoma    ED Discharge Orders    None       Victoriano Laindol, Sundra Haddix, PA-C 06/03/18 Shawnie [id number]    Cook, Brian, MD 06/03/18 [id number]

## 2018-06-03 NOTE — Discharge Instructions (Addendum)
Continue to use a gently compressive ace wrap as discussed.  Ice will continue to help reduce the swelling.  Call Dr. Arbie Cookey as discussed for a recheck of this swelling.  In the interim, return here if you develop any new or worsening symptoms including pain, expanding swelling or if you develop weakness or numbness in your left hand or fingers.

## 2018-06-20 DIAGNOSIS — D692 Other nonthrombocytopenic purpura: Secondary | ICD-10-CM | POA: Diagnosis not present

## 2018-06-20 DIAGNOSIS — S5012XA Contusion of left forearm, initial encounter: Secondary | ICD-10-CM | POA: Diagnosis not present

## 2018-06-20 DIAGNOSIS — E46 Unspecified protein-calorie malnutrition: Secondary | ICD-10-CM | POA: Diagnosis not present

## 2018-06-20 DIAGNOSIS — Z681 Body mass index (BMI) 19 or less, adult: Secondary | ICD-10-CM | POA: Diagnosis not present

## 2018-07-05 DIAGNOSIS — K210 Gastro-esophageal reflux disease with esophagitis: Secondary | ICD-10-CM | POA: Diagnosis not present

## 2018-07-05 DIAGNOSIS — Z7689 Persons encountering health services in other specified circumstances: Secondary | ICD-10-CM | POA: Diagnosis not present

## 2018-07-05 DIAGNOSIS — I10 Essential (primary) hypertension: Secondary | ICD-10-CM | POA: Diagnosis not present

## 2018-07-05 DIAGNOSIS — R300 Dysuria: Secondary | ICD-10-CM | POA: Diagnosis not present

## 2018-07-05 DIAGNOSIS — R5383 Other fatigue: Secondary | ICD-10-CM | POA: Diagnosis not present

## 2018-07-09 DIAGNOSIS — Z0001 Encounter for general adult medical examination with abnormal findings: Secondary | ICD-10-CM | POA: Diagnosis not present

## 2018-07-09 DIAGNOSIS — I5032 Chronic diastolic (congestive) heart failure: Secondary | ICD-10-CM | POA: Diagnosis not present

## 2018-07-09 DIAGNOSIS — N182 Chronic kidney disease, stage 2 (mild): Secondary | ICD-10-CM | POA: Diagnosis not present

## 2018-07-09 DIAGNOSIS — S5012XA Contusion of left forearm, initial encounter: Secondary | ICD-10-CM | POA: Diagnosis not present

## 2018-07-09 DIAGNOSIS — I10 Essential (primary) hypertension: Secondary | ICD-10-CM | POA: Diagnosis not present

## 2018-07-09 DIAGNOSIS — M3210 Systemic lupus erythematosus, organ or system involvement unspecified: Secondary | ICD-10-CM | POA: Diagnosis not present

## 2018-07-09 DIAGNOSIS — N3946 Mixed incontinence: Secondary | ICD-10-CM | POA: Diagnosis not present

## 2018-07-09 DIAGNOSIS — Z681 Body mass index (BMI) 19 or less, adult: Secondary | ICD-10-CM | POA: Diagnosis not present

## 2018-07-31 DIAGNOSIS — I10 Essential (primary) hypertension: Secondary | ICD-10-CM | POA: Diagnosis not present

## 2018-07-31 DIAGNOSIS — E7800 Pure hypercholesterolemia, unspecified: Secondary | ICD-10-CM | POA: Diagnosis not present

## 2018-07-31 DIAGNOSIS — N182 Chronic kidney disease, stage 2 (mild): Secondary | ICD-10-CM | POA: Diagnosis not present

## 2018-08-31 DIAGNOSIS — I10 Essential (primary) hypertension: Secondary | ICD-10-CM | POA: Diagnosis not present

## 2018-08-31 DIAGNOSIS — E119 Type 2 diabetes mellitus without complications: Secondary | ICD-10-CM | POA: Diagnosis not present

## 2018-09-14 DIAGNOSIS — R300 Dysuria: Secondary | ICD-10-CM | POA: Diagnosis not present

## 2018-10-22 DIAGNOSIS — R300 Dysuria: Secondary | ICD-10-CM | POA: Diagnosis not present

## 2018-11-15 DIAGNOSIS — M8588 Other specified disorders of bone density and structure, other site: Secondary | ICD-10-CM | POA: Diagnosis not present

## 2018-11-15 DIAGNOSIS — M810 Age-related osteoporosis without current pathological fracture: Secondary | ICD-10-CM | POA: Diagnosis not present

## 2018-11-15 DIAGNOSIS — M85851 Other specified disorders of bone density and structure, right thigh: Secondary | ICD-10-CM | POA: Diagnosis not present

## 2019-01-04 DIAGNOSIS — R300 Dysuria: Secondary | ICD-10-CM | POA: Diagnosis not present

## 2019-01-23 DIAGNOSIS — R300 Dysuria: Secondary | ICD-10-CM | POA: Diagnosis not present

## 2022-07-02 DEATH — deceased
